# Patient Record
Sex: Male | Born: 1960 | Race: Black or African American | Hispanic: No | Marital: Married | Smoking: Never smoker
Health system: Southern US, Community
[De-identification: ages and names within clinical notes are randomized; demographics above are authoritative.]

## PROBLEM LIST (undated history)

## (undated) DIAGNOSIS — G4730 Sleep apnea, unspecified: Secondary | ICD-10-CM

## (undated) DIAGNOSIS — K219 Gastro-esophageal reflux disease without esophagitis: Secondary | ICD-10-CM

## (undated) DIAGNOSIS — I10 Essential (primary) hypertension: Secondary | ICD-10-CM

## (undated) HISTORY — DX: Gastro-esophageal reflux disease without esophagitis: K21.9

## (undated) HISTORY — PX: COLONOSCOPY: SHX174

---

## 2005-05-17 ENCOUNTER — Inpatient Hospital Stay (HOSPITAL_COMMUNITY): Admission: AD | Admit: 2005-05-17 | Discharge: 2005-05-19 | Payer: Self-pay | Admitting: Internal Medicine

## 2010-07-03 ENCOUNTER — Emergency Department (HOSPITAL_COMMUNITY): Admission: EM | Admit: 2010-07-03 | Discharge: 2010-07-03 | Payer: Self-pay | Admitting: Emergency Medicine

## 2010-11-19 ENCOUNTER — Other Ambulatory Visit
Admission: RE | Admit: 2010-11-19 | Discharge: 2010-11-19 | Payer: Self-pay | Source: Home / Self Care | Admitting: Urology

## 2011-04-30 NOTE — H&P (Signed)
NAME:  Aaron Peck, Aaron Peck             ACCOUNT NO.:  [id number]   MEDICAL RECORD NO.:  [id number]          PATIENT TYPE:  INP   LOCATION:  A210                          FACILITY:  [facility]   PHYSICIAN:  Madelin Rear. Sherwood Gambler, MD  DATE OF BIRTH:  [birth year]   DATE OF ADMISSION:  05/17/2005  DATE OF DISCHARGE:  LH                                HISTORY & PHYSICAL   CHIEF COMPLAINT:  Chest pain.   HISTORY OF PRESENT ILLNESS:  Patient has had 2 days of on and off atypical  chest discomfort.  He states that on one occasion for several minutes it  radiated from the central sternal area through to his back.  He denied any  associated cardiopulmonary symptoms of palpitations, syncope, nausea,  vomiting or diaphoresis.  No shortness of breath.  No cough or sputum or  antidromal symptoms.  He denies any GI symptoms except he has some  indigestion several days prior to the onset of this.  He was not certain  whether it was related to dietary indiscretion or not.   PAST MEDICAL HISTORY:  He has had no previous surgeries.  No known  allergies.  He has had hypertension but is currently taking 240 mg of  Cardizem once daily for that.   SOCIAL HISTORY:  Does not smoke, drink or use any illicit drugs.   FAMILY HISTORY:  Positive for father deceased from carcinoma, mother with  hypertension, paternal grandmother had diabetes mellitus, unknown etiology  of death of maternal grandfather and maternal grandmother.  He has one child  that is healthy.   REVIEW OF SYSTEMS:  Under HPI.  All else is negative.   PHYSICAL EXAMINATION:  SKIN:  Unremarkable.  HEAD AND NECK:  No JVD or adenopathy.  Neck is supple.  CHEST:  Clear.  CARDIAC EXAM:  Regular rhythm without murmur, gallop or rub.  ABDOMEN:  Soft.  No organomegaly or mass.  EXTREMITIES:  Without clubbing, cyanosis or edema.  NEUROLOGIC EXAMINATION:  Nonfocal.  VITAL SIGNS:  His blood pressure in the office where he was evaluated, it  was elevated at  150/100.   His EKG in the office showed a slight change in V6 compared to 60-year-old  EKG comparison with some elevation in that segment of about a millimeter.  It is very subtle and might be lead placement.   IMPRESSION:  Unexplained chest pain with radiation to posterior thorax in a  hypertensive patient.  He will get a CT scan upon admission as well as  serial cardiac enzymes.  Differential diagnostic possibilities include acute  aortic dissection, ischemic pain with new onset of angina versus myocardial  infarction as well as esophageal etiologies including reflux and spasm.  Cardiology consultation is anticipated.       LJF/MEDQ  D:  05/17/2005  T:  05/17/2005  Job:  [id number]

## 2011-04-30 NOTE — Discharge Summary (Signed)
NAME:  Aaron Peck, Aaron Peck             ACCOUNT NO.:  [id number]   MEDICAL RECORD NO.:  [id number]          PATIENT TYPE:  INP   LOCATION:  A210                          FACILITY:  [facility]   PHYSICIAN:  Madelin Rear. Sherwood Gambler, MD  DATE OF BIRTH:  1961-03-21   DATE OF ADMISSION:  05/17/2005  DATE OF DISCHARGE:  06/07/2006LH                                 DISCHARGE SUMMARY   DISCHARGE MEDICATIONS:  1.  Cardizem CD 360 mg p.o. daily.  2.  HCTZ 12.5 mg p.o. daily.  3.  Aspirin 81 mg p.o. daily.   DISCHARGE DIAGNOSES:  Chest pain, unclear etiology.   SUMMARY:  The patient was admitted with atypical chest pain and equivocal  EKG changes based on comparison electrocardiograms.  Serial enzymes were  negative.  CT of the chest was negative.  The patient was seen in  consultation by Cardiology.  Discharge day symptomatic.  Follow up with  Cardiology for anticipated stress Cardiolite.       LJF/MEDQ  D:  06/18/2005  T:  06/18/2005  Job:  [id number]

## 2013-05-13 ENCOUNTER — Emergency Department (HOSPITAL_COMMUNITY): Payer: Managed Care, Other (non HMO)

## 2013-05-13 ENCOUNTER — Encounter (HOSPITAL_COMMUNITY): Payer: Self-pay

## 2013-05-13 ENCOUNTER — Emergency Department (HOSPITAL_COMMUNITY)
Admission: EM | Admit: 2013-05-13 | Discharge: 2013-05-13 | Disposition: A | Discharge status: Home / Self Care | Payer: Managed Care, Other (non HMO) | Attending: Emergency Medicine | Admitting: Emergency Medicine

## 2013-05-13 DIAGNOSIS — Z79899 Other long term (current) drug therapy: Secondary | ICD-10-CM | POA: Insufficient documentation

## 2013-05-13 DIAGNOSIS — I10 Essential (primary) hypertension: Secondary | ICD-10-CM | POA: Insufficient documentation

## 2013-05-13 DIAGNOSIS — R109 Unspecified abdominal pain: Secondary | ICD-10-CM | POA: Insufficient documentation

## 2013-05-13 DIAGNOSIS — R1030 Lower abdominal pain, unspecified: Secondary | ICD-10-CM

## 2013-05-13 DIAGNOSIS — Z7982 Long term (current) use of aspirin: Secondary | ICD-10-CM | POA: Insufficient documentation

## 2013-05-13 HISTORY — DX: Essential (primary) hypertension: I10

## 2013-05-13 LAB — URINALYSIS, ROUTINE W REFLEX MICROSCOPIC
Ketones, ur: NEGATIVE mg/dL
Leukocytes, UA: NEGATIVE
Protein, ur: NEGATIVE mg/dL

## 2013-05-13 LAB — URINE MICROSCOPIC-ADD ON

## 2013-05-13 MED ORDER — HYDROCODONE-ACETAMINOPHEN 5-325 MG PO TABS
1.0000 | ORAL_TABLET | Freq: Once | ORAL | Status: AC
  Administered 2013-05-13: 1 via ORAL
  Filled 2013-05-13: qty 1

## 2013-05-13 MED ORDER — HYDROCODONE-ACETAMINOPHEN 5-325 MG PO TABS: 1.0000 | ORAL_TABLET | ORAL | Status: DC | PRN

## 2013-05-13 NOTE — ED Provider Notes (Signed)
History     CSN: [id number]  Arrival date & time 05/13/13  0122   First MD Initiated Contact with Patient 05/13/13 0208      No chief complaint on file.   (Consider location/radiation/quality/duration/timing/severity/associated sxs/prior treatment) HPI HPI Comments: Aaron Peck is a 52 y.o. male who presents to the Emergency Department complaining of right groin pain that began at 2300 and has been persistent aching. He took ibuprofen with no relief. Denies fever, chills, nausea, vomiting,shortness of breath. He has been urinating more than usual. He has no h/o kidney stones.   PCP Dr. Loleta Chance  Past Medical History  Diagnosis Date  . Hypertension     History reviewed. No pertinent past surgical history.  No family history on file.  History  Substance Use Topics  . Smoking status: Never Smoker   . Smokeless tobacco: Not on file  . Alcohol Use: No      Review of Systems  Constitutional: Negative for fever.       10 Systems reviewed and are negative for acute change except as noted in the HPI.  HENT: Negative for congestion.   Eyes: Negative for discharge and redness.  Respiratory: Negative for cough and shortness of breath.   Cardiovascular: Negative for chest pain.  Gastrointestinal: Negative for vomiting and abdominal pain.  Musculoskeletal: Negative for back pain.  Skin: Negative for rash.  Neurological: Negative for syncope, numbness and headaches.  Psychiatric/Behavioral:       No behavior change.    Allergies  Review of patient's allergies indicates no known allergies.  Home Medications   Current Outpatient Rx  Name  Route  Sig  Dispense  Refill  . aspirin 81 MG tablet   Oral   Take 81 mg by mouth daily.         Marland Kitchen diltiazem (CARDIZEM) 120 MG tablet   Oral   Take 120 mg by mouth 4 (four) times daily.         Marland Kitchen lisinopril (PRINIVIL,ZESTRIL) 20 MG tablet   Oral   Take 20 mg by mouth daily.         . Multiple Vitamin (MULTIVITAMIN)  capsule   Oral   Take 1 capsule by mouth daily.         Marland Kitchen omeprazole (PRILOSEC) 20 MG capsule   Oral   Take 20 mg by mouth daily.           BP 166/101  Temp(Src) 98 F (36.7 C) (Oral)  Resp 18  Ht 5\' 4"  (1.626 m)  Wt 200 lb (90.719 kg)  BMI 34.31 kg/m2  SpO2 100%  Physical Exam  Nursing note and vitals reviewed. Constitutional:  Awake, alert, nontoxic appearance.  HENT:  Head: Normocephalic and atraumatic.  Eyes: EOM are normal. Pupils are equal, round, and reactive to light.  Neck: Normal range of motion. Neck supple.  Cardiovascular: Normal rate and intact distal pulses.   Pulmonary/Chest: Effort normal and breath sounds normal. He exhibits no tenderness.  Abdominal: Soft. Bowel sounds are normal. There is no tenderness. There is no rebound.  No palpable tenderness noted  Genitourinary:  Genital and rectal exam performed with pt permission and male ED Tech chaparone present during exam.  Pt examined laying and standing.  No perineal erythema.  No penile lesions or drainage.  No scrotal erythema, edema or tenderness to palp.  Normal testicular lie.  No testicular tenderness to palp.  +cremasteric reflexes bilat.  No inguinal LAN or palpable masses.   Musculoskeletal:  He exhibits no tenderness.  Baseline ROM, no obvious new focal weakness.  Neurological:  Mental status and motor strength appears baseline for patient and situation.  Skin: No rash noted.  Psychiatric: He has a normal mood and affect.    ED Course  Procedures (including critical care time)  Results for orders placed during the hospital encounter of 05/13/13  URINALYSIS, ROUTINE W REFLEX MICROSCOPIC      Result Value Range   Color, Urine YELLOW  YELLOW   APPearance CLEAR  CLEAR   Specific Gravity, Urine 1.015  1.005 - 1.030   pH 7.5  5.0 - 8.0   Glucose, UA NEGATIVE  NEGATIVE mg/dL   Hgb urine dipstick SMALL (*) NEGATIVE   Bilirubin Urine NEGATIVE  NEGATIVE   Ketones, ur NEGATIVE  NEGATIVE mg/dL    Protein, ur NEGATIVE  NEGATIVE mg/dL   Urobilinogen, UA 0.2  0.0 - 1.0 mg/dL   Nitrite NEGATIVE  NEGATIVE   Leukocytes, UA NEGATIVE  NEGATIVE  URINE MICROSCOPIC-ADD ON      Result Value Range   Squamous Epithelial / LPF RARE  RARE   WBC, UA 0-2  <3 WBC/hpf   RBC / HPF 3-6  <3 RBC/hpf   Bacteria, UA RARE  RARE   Dg Abd Acute W/chest  05/13/2013   *RADIOLOGY REPORT*  Clinical Data: Right flank pain  ACUTE ABDOMEN SERIES (ABDOMEN 2 VIEW & CHEST 1 VIEW)  Comparison: None.  Findings: Lungs clear.  Cardiomediastinal contours unchanged.  No free intraperitoneal air. The bowel gas pattern is non-obstructive. Organ outlines are normal where seen. No acute or aggressive osseous abnormality identified.  No renal stone identified. Radiograph has limited sensitivity for stone detection.  IMPRESSION: Nonobstructive bowel gas pattern.   Original Report Authenticated By: Jearld Lesch, M.D.     MDM  Patient with right groin pain x 2 hours. No findings with genital exam. UA without acute findings. Acute abdominal series without findings. Given hydrocodone. Pt stable in ED with no significant deterioration in condition.The patient appears reasonably screened and/or stabilized for discharge and I doubt any other medical condition or other [facility] requiring further screening, evaluation, or treatment in the ED at this time prior to discharge.  MDM Reviewed: nursing note and vitals Interpretation: labs and x-ray           Nicoletta Dress. Colon Branch, MD 05/13/13 [phone]

## 2013-05-13 NOTE — ED Notes (Signed)
Pain in right groin x 2 hours, denies pain worse with movement.

## 2014-02-06 ENCOUNTER — Encounter: Payer: Self-pay | Admitting: Gastroenterology

## 2014-02-26 ENCOUNTER — Ambulatory Visit: Payer: Managed Care, Other (non HMO) | Admitting: Gastroenterology

## 2014-03-05 ENCOUNTER — Encounter (INDEPENDENT_AMBULATORY_CARE_PROVIDER_SITE_OTHER): Payer: Self-pay

## 2014-03-05 ENCOUNTER — Encounter: Payer: Self-pay | Admitting: Gastroenterology

## 2014-03-05 ENCOUNTER — Ambulatory Visit (INDEPENDENT_AMBULATORY_CARE_PROVIDER_SITE_OTHER): Payer: Managed Care, Other (non HMO) | Admitting: Gastroenterology

## 2014-03-05 ENCOUNTER — Encounter (HOSPITAL_COMMUNITY): Payer: Self-pay | Admitting: Pharmacy Technician

## 2014-03-05 VITALS — BP 135/87 | HR 77 | Temp 98.4°F | Ht 64.0 in | Wt 200.6 lb

## 2014-03-05 DIAGNOSIS — R195 Other fecal abnormalities: Secondary | ICD-10-CM | POA: Insufficient documentation

## 2014-03-05 MED ORDER — PEG 3350-KCL-NA BICARB-NACL 420 G PO SOLR: 4000.0000 mL | ORAL | Status: DC

## 2014-03-05 NOTE — Progress Notes (Signed)
Primary Care Physician:  Maggie Font, MD Primary Gastroenterologist:  Dr. Oneida Alar   Chief Complaint  Patient presents with  . Colon Cancer Screening    HPI:   Aaron Peck presents today at the request of Dr. Iona Beard secondary to heme positive stool. No hematochezia or melena. No abdominal pain. No constipation, diarrhea. No weight loss, lack of appetite. On Prilosec daily for GERD without breakthrough symptoms. No dysphagia. Recent Hgb 13.9. Last colonoscopy about 5 years ago, patient unsure who performed.   Past Medical History  Diagnosis Date  . Hypertension   . GERD (gastroesophageal reflux disease)     Past Surgical History  Procedure Laterality Date  . Colonoscopy      can not remeber when but it has been awhile, at least 5 years ago    Current Outpatient Prescriptions  Medication Sig Dispense Refill  . aspirin 81 MG tablet Take 81 mg by mouth daily.      Marland Kitchen lisinopril (PRINIVIL,ZESTRIL) 20 MG tablet Take 20 mg by mouth daily.      . Multiple Vitamin (MULTIVITAMIN) capsule Take 1 capsule by mouth daily.      Marland Kitchen omeprazole (PRILOSEC) 20 MG capsule Take 20 mg by mouth daily.      Marland Kitchen diltiazem (DILACOR XR) 120 MG 24 hr capsule Take 120 mg by mouth daily.       No current facility-administered medications for this visit.    Allergies as of 03/05/2014  . (No Known Allergies)    Family History  Problem Relation Age of Onset  . Colon cancer Neg Hx     History   Social History  . Marital Status: Single    Spouse Name: [name]    Number of Children: [name]  . Years of Education: [name]   Occupational History  . [employer]    Social History Main Topics  . Smoking status: Never Smoker   . Smokeless tobacco: Not on file  . Alcohol Use: No  . Drug Use: No  . Sexual Activity: Not on file   Other Topics Concern  . Not on file   Social History Narrative  . No narrative on file    Review of Systems: Gen: Denies any fever, chills, fatigue, weight loss, lack  of appetite.  CV: Denies chest pain, heart palpitations, peripheral edema, syncope.  Resp: Denies shortness of breath at rest or with exertion. Denies wheezing or cough.  GI: see HPI GU : Denies urinary burning, urinary frequency, urinary hesitancy MS: Denies joint pain, muscle weakness, cramps, or limitation of movement.  Derm: Denies rash, itching, dry skin Psych: Denies depression, anxiety, memory loss, and confusion Heme: Denies bruising, bleeding, and enlarged lymph nodes.  Physical Exam: BP 135/87  Pulse 77  Temp(Src) 98.4 F (36.9 C) (Oral)  Ht 5\' 4"  (1.626 m)  Wt 200 lb 9.6 oz (90.992 kg)  BMI 34.42 kg/m2 General:   Alert and oriented. Pleasant and cooperative. Well-nourished and well-developed.  Head:  Normocephalic and atraumatic. Eyes:  Without icterus, sclera clear and conjunctiva pink.  Ears:  Normal auditory acuity. Nose:  No deformity, discharge,  or lesions. Mouth:  No deformity or lesions, oral mucosa pink.  Neck:  Supple, without mass or thyromegaly. Lungs:  Clear to auscultation bilaterally. No wheezes, rales, or rhonchi. No distress.  Heart:  S1, S2 present without murmurs appreciated.  Abdomen:  +BS, soft, non-tender and non-distended. No HSM noted. No guarding or rebound. No masses appreciated.  Rectal:  Deferred  Msk:  Symmetrical without gross deformities. Normal posture. Extremities:  Without clubbing or edema. Neurologic:  Alert and  oriented x4;  grossly normal neurologically. Skin:  Intact without significant lesions or rashes. Cervical Nodes:  No significant cervical adenopathy. Psych:  Alert and cooperative. Normal mood and affect.

## 2014-03-05 NOTE — Patient Instructions (Signed)
We have scheduled you for a colonoscopy with Dr. Oneida Alar in the near future.  Further recommendations to follow once completed!

## 2014-03-05 NOTE — Assessment & Plan Note (Signed)
53 year old male with heme positive stool but no other concerning lower or upper GI symptoms. Last colonoscopy around 5 years ago, but patient is unsure who performed. hgb normal on outside labs. Proceed with colonoscopy with Dr. Oneida Alar in the near future. The risks, benefits, and alternatives have been discussed in detail with the patient. They state understanding and desire to proceed.

## 2014-03-06 NOTE — Progress Notes (Signed)
cc'd to pcp 

## 2014-03-12 ENCOUNTER — Ambulatory Visit (HOSPITAL_COMMUNITY)
Admission: RE | Admit: 2014-03-12 | Discharge: 2014-03-12 | Disposition: A | Discharge status: Home / Self Care | Payer: Managed Care, Other (non HMO) | Source: Ambulatory Visit | Attending: Gastroenterology | Admitting: Gastroenterology

## 2014-03-12 ENCOUNTER — Encounter (HOSPITAL_COMMUNITY)
Admission: RE | Disposition: A | Discharge status: Home / Self Care | Payer: Self-pay | Source: Ambulatory Visit | Attending: Gastroenterology

## 2014-03-12 ENCOUNTER — Encounter (HOSPITAL_COMMUNITY): Payer: Self-pay | Admitting: *Deleted

## 2014-03-12 DIAGNOSIS — K5730 Diverticulosis of large intestine without perforation or abscess without bleeding: Secondary | ICD-10-CM | POA: Insufficient documentation

## 2014-03-12 DIAGNOSIS — R195 Other fecal abnormalities: Secondary | ICD-10-CM

## 2014-03-12 DIAGNOSIS — K219 Gastro-esophageal reflux disease without esophagitis: Secondary | ICD-10-CM | POA: Insufficient documentation

## 2014-03-12 DIAGNOSIS — I10 Essential (primary) hypertension: Secondary | ICD-10-CM | POA: Insufficient documentation

## 2014-03-12 DIAGNOSIS — Z1211 Encounter for screening for malignant neoplasm of colon: Secondary | ICD-10-CM

## 2014-03-12 DIAGNOSIS — Z7982 Long term (current) use of aspirin: Secondary | ICD-10-CM | POA: Insufficient documentation

## 2014-03-12 DIAGNOSIS — K648 Other hemorrhoids: Secondary | ICD-10-CM | POA: Insufficient documentation

## 2014-03-12 DIAGNOSIS — Q438 Other specified congenital malformations of intestine: Secondary | ICD-10-CM | POA: Insufficient documentation

## 2014-03-12 DIAGNOSIS — Z79899 Other long term (current) drug therapy: Secondary | ICD-10-CM | POA: Insufficient documentation

## 2014-03-12 DIAGNOSIS — D126 Benign neoplasm of colon, unspecified: Secondary | ICD-10-CM

## 2014-03-12 HISTORY — PX: COLONOSCOPY: SHX5424

## 2014-03-12 SURGERY — COLONOSCOPY
Anesthesia: Moderate Sedation

## 2014-03-12 MED ORDER — STERILE WATER FOR IRRIGATION IR SOLN
Status: DC | PRN
  Administered 2014-03-12: 11:00:00

## 2014-03-12 MED ORDER — SODIUM CHLORIDE 0.9 % IV SOLN: INTRAVENOUS | Status: DC

## 2014-03-12 MED ORDER — MIDAZOLAM HCL 5 MG/5ML IJ SOLN
INTRAMUSCULAR | Status: AC
  Filled 2014-03-12: qty 10

## 2014-03-12 MED ORDER — MEPERIDINE HCL 100 MG/ML IJ SOLN
INTRAMUSCULAR | Status: DC | PRN
  Administered 2014-03-12 (×2): 25 mg via INTRAVENOUS

## 2014-03-12 MED ORDER — MEPERIDINE HCL 100 MG/ML IJ SOLN
INTRAMUSCULAR | Status: AC
  Filled 2014-03-12: qty 2

## 2014-03-12 MED ORDER — MIDAZOLAM HCL 5 MG/5ML IJ SOLN
INTRAMUSCULAR | Status: DC | PRN
  Administered 2014-03-12: 1 mg via INTRAVENOUS
  Administered 2014-03-12 (×2): 2 mg via INTRAVENOUS

## 2014-03-12 NOTE — Progress Notes (Signed)
REVIEWED.  

## 2014-03-12 NOTE — Op Note (Signed)
[facility] [address] [place], [address]   COLONOSCOPY PROCEDURE REPORT  PATIENT: Aaron Peck, Aaron Peck.  MR#: [id number] BIRTHDATE: July 05, 1961 , 53  yrs. old GENDER: Male ENDOSCOPIST: Barney Drain, MD REFERRED IW:[id number] Hill, M.D. PROCEDURE DATE:  03/12/2014 PROCEDURE:   Colonoscopy with cold biopsy polypectomy INDICATIONS:Average risk patient for colon cancer. MEDICATIONS: Demerol 50 mg IV and Versed 5 mg IV  DESCRIPTION OF PROCEDURE:    Physical exam was performed.  Informed consent was obtained from the patient after explaining the benefits, risks, and alternatives to procedure.  The patient was connected to monitor and placed in left lateral position. Continuous oxygen was provided by nasal cannula and IV medicine administered through an indwelling cannula.  After administration of sedation and rectal exam, the patients rectum was intubated and the EG-[id number] ([id number]) and EC-[id number] ([id number])  colonoscope was advanced under direct visualization to the ileum.  The scope was removed slowly by carefully examining the color, texture, anatomy, and integrity mucosa on the way out.  The patient was recovered in endoscopy and discharged home in satisfactory condition.    COLON FINDINGS: The mucosa appeared normal in the terminal ileum.  , A sessile polyp measuring 2 mm in size was found in the sigmoid colon.  A polypectomy was performed with cold forceps.  , The LEFT colon IS SLIGHTLY redundant.  , Mild diverticulosis was noted in the descending colon and sigmoid colon.  Large internal hemorrhoids were found.  PREP QUALITY: good.  CECAL W/D TIME: 12 minutes     COMPLICATIONS: None  ENDOSCOPIC IMPRESSION: 1.   Normal mucosa in the terminal ileum 2.   ONE COLON POLYP REMOVED 3.   The colon Is redundant 4.   Mild diverticulosis  in the descending colon and sigmoid colon 5.   Large internal hemorrhoids   RECOMMENDATIONS: AWAIT BIOPSY HIGH FIBER DIET TCS IN  10 YEARS.  CONSIDER OVERTUBE.       _______________________________ Lorrin MaisBarney Drain, MD 03/12/2014 11:22 AM

## 2014-03-12 NOTE — Discharge Instructions (Signed)
You had 1 small polyp removed. You have LARGE internal hemorrhoids and diverticulosis IN YOUR LEFT COLON.   FOLLOW A HIGH FIBER DIET. AVOID ITEMS THAT CAUSE BLOATING. SEE INFO BELOW.  YOUR BIOPSY RESULTS SHOULD BE BACK IN 7 DAYS.  Next colonoscopy in 10 years.   Colonoscopy Care After Read the instructions outlined below and refer to this sheet in the next week. These discharge instructions provide you with general information on caring for yourself after you leave the hospital. While your treatment has been planned according to the most current medical practices available, unavoidable complications occasionally occur. If you have any problems or questions after discharge, call DR. Ketty Bitton, [phone].  ACTIVITY  You may resume your regular activity, but move at a slower pace for the next 24 hours.   Take frequent rest periods for the next 24 hours.   Walking will help get rid of the air and reduce the bloated feeling in your belly (abdomen).   No driving for 24 hours (because of the medicine (anesthesia) used during the test).   You may shower.   Do not sign any important legal documents or operate any machinery for 24 hours (because of the anesthesia used during the test).    NUTRITION  Drink plenty of fluids.   You may resume your normal diet as instructed by your doctor.   Begin with a light meal and progress to your normal diet. Heavy or fried foods are harder to digest and may make you feel sick to your stomach (nauseated).   Avoid alcoholic beverages for 24 hours or as instructed.    MEDICATIONS  You may resume your normal medications.   WHAT YOU CAN EXPECT TODAY  Some feelings of bloating in the abdomen.   Passage of more gas than usual.   Spotting of blood in your stool or on the toilet paper  .  IF YOU HAD POLYPS REMOVED DURING THE COLONOSCOPY:  Eat a soft diet IF YOU HAVE NAUSEA, BLOATING, ABDOMINAL PAIN, OR VOMITING.    FINDING OUT THE RESULTS  OF YOUR TEST Not all test results are available during your visit. DR. Oneida Alar WILL CALL YOU WITHIN 7 DAYS OF YOUR PROCEDUE WITH YOUR RESULTS. Do not assume everything is normal if you have not heard from DR. Rylei Codispoti IN ONE WEEK, CALL HER OFFICE AT [phone].  SEEK IMMEDIATE MEDICAL ATTENTION AND CALL THE OFFICE: [phone] IF:  You have more than a spotting of blood in your stool.   Your belly is swollen (abdominal distention).   You are nauseated or vomiting.   You have a temperature over 101F.   You have abdominal pain or discomfort that is severe or gets worse throughout the day.  Polyps, Colon  A polyp is extra tissue that grows inside your body. Colon polyps grow in the large intestine. The large intestine, also called the colon, is part of your digestive system. It is a long, hollow tube at the end of your digestive tract where your body makes and stores stool. Most polyps are not dangerous. They are benign. This means they are not cancerous. But over time, some types of polyps can turn into cancer. Polyps that are smaller than a pea are usually not harmful. But larger polyps could someday become or may already be cancerous. To be safe, doctors remove all polyps and test them.   WHO GETS POLYPS? Anyone can get polyps, but certain people are more likely than others. You may have a greater chance of  getting polyps if:  You are over 50.   You have had polyps before.   Someone in your family has had polyps.   Someone in your family has had cancer of the large intestine.   Find out if someone in your family has had polyps. You may also be more likely to get polyps if you:   Eat a lot of fatty foods   Smoke   Drink alcohol   Do not exercise  Eat too much   TREATMENT  The caregiver will remove the polyp during sigmoidoscopy or colonoscopy.  PREVENTION There is not one sure way to prevent polyps. You might be able to lower your risk of getting them if you:  Eat more  fruits and vegetables and less fatty food.   Do not smoke.   Avoid alcohol.   Exercise every day.   Lose weight if you are overweight.   Eating more calcium and folate can also lower your risk of getting polyps. Some foods that are rich in calcium are milk, cheese, and broccoli. Some foods that are rich in folate are chickpeas, kidney beans, and spinach.   High-Fiber Diet A high-fiber diet changes your normal diet to include more whole grains, legumes, fruits, and vegetables. Changes in the diet involve replacing refined carbohydrates with unrefined foods. The calorie level of the diet is essentially unchanged. The Dietary Reference Intake (recommended amount) for adult males is 38 grams per day. For adult females, it is 25 grams per day. Pregnant and lactating women should consume 28 grams of fiber per day. Fiber is the intact part of a plant that is not broken down during digestion. Functional fiber is fiber that has been isolated from the plant to provide a beneficial effect in the body. PURPOSE  Increase stool bulk.   Ease and regulate bowel movements.   Lower cholesterol.  INDICATIONS THAT YOU NEED MORE FIBER  Constipation and hemorrhoids.   Uncomplicated diverticulosis (intestine condition) and irritable bowel syndrome.   Weight management.   As a protective measure against hardening of the arteries (atherosclerosis), diabetes, and cancer.   GUIDELINES FOR INCREASING FIBER IN THE DIET  Start adding fiber to the diet slowly. A gradual increase of about 5 more grams (2 slices of whole-wheat bread, 2 servings of most fruits or vegetables, or 1 bowl of high-fiber cereal) per day is best. Too rapid an increase in fiber may result in constipation, flatulence, and bloating.   Drink enough water and fluids to keep your urine clear or pale yellow. Water, juice, or caffeine-free drinks are recommended. Not drinking enough fluid may cause constipation.   Eat a variety of high-fiber  foods rather than one type of fiber.   Try to increase your intake of fiber through using high-fiber foods rather than fiber pills or supplements that contain small amounts of fiber.   The goal is to change the types of food eaten. Do not supplement your present diet with high-fiber foods, but replace foods in your present diet.  INCLUDE A VARIETY OF FIBER SOURCES  Replace refined and processed grains with whole grains, canned fruits with fresh fruits, and incorporate other fiber sources. White rice, white breads, and most bakery goods contain little or no fiber.   Brown whole-grain rice, buckwheat oats, and many fruits and vegetables are all good sources of fiber. These include: broccoli, Brussels sprouts, cabbage, cauliflower, beets, sweet potatoes, white potatoes (skin on), carrots, tomatoes, eggplant, squash, berries, fresh fruits, and dried fruits.  Cereals appear to be the richest source of fiber. Cereal fiber is found in whole grains and bran. Bran is the fiber-rich outer coat of cereal grain, which is largely removed in refining. In whole-grain cereals, the bran remains. In breakfast cereals, the largest amount of fiber is found in those with "bran" in their names. The fiber content is sometimes indicated on the label.   You may need to include additional fruits and vegetables each day.   In baking, for 1 cup white flour, you may use the following substitutions:   1 cup whole-wheat flour minus 2 tablespoons.   1/2 cup white flour plus 1/2 cup whole-wheat flour.   Diverticulosis Diverticulosis is a common condition that develops when small pouches (diverticula) form in the wall of the colon. The risk of diverticulosis increases with age. It happens more often in people who eat a low-fiber diet. Most individuals with diverticulosis have no symptoms. Those individuals with symptoms usually experience belly (abdominal) pain, constipation, or loose stools (diarrhea).  HOME CARE  INSTRUCTIONS  Increase the amount of fiber in your diet as directed by your caregiver or dietician. This may reduce symptoms of diverticulosis.   Drink at least 6 to 8 glasses of water each day to prevent constipation.   Try not to strain when you have a bowel movement.   Avoiding nuts and seeds to prevent complications is still an uncertain benefit.       FOODS HAVING HIGH FIBER CONTENT INCLUDE:  Fruits. Apple, peach, pear, tangerine, raisins, prunes.   Vegetables. Brussels sprouts, asparagus, broccoli, cabbage, carrot, cauliflower, romaine lettuce, spinach, summer squash, tomato, winter squash, zucchini.   Starchy Vegetables. Baked beans, kidney beans, lima beans, split peas, lentils, potatoes (with skin).   Grains. Whole wheat bread, brown rice, bran flake cereal, plain oatmeal, white rice, shredded wheat, bran muffins.    SEEK IMMEDIATE MEDICAL CARE IF:  You develop increasing pain or severe bloating.   You have an oral temperature above 101F.   You develop vomiting or bowel movements that are bloody or black.   Hemorrhoids Hemorrhoids are dilated (enlarged) veins around the rectum. Sometimes clots will form in the veins. This makes them swollen and painful. These are called thrombosed hemorrhoids. Causes of hemorrhoids include:  Constipation.   Straining to have a bowel movement.   HEAVY LIFTING HOME CARE INSTRUCTIONS  Eat a well balanced diet and drink 6 to 8 glasses of water every day to avoid constipation. You may also use a bulk laxative.   Avoid straining to have bowel movements.   Keep anal area dry and clean.   Do not use a donut shaped pillow or sit on the toilet for long periods. This increases blood pooling and pain.   Move your bowels when your body has the urge; this will require less straining and will decrease pain and pressure.

## 2014-03-12 NOTE — H&P (Signed)
  Primary Care Physician:  Maggie Font, MD Primary Gastroenterologist:  Dr. Oneida Alar  Pre-Procedure History & Physical: HPI:  Aaron Peck is a 53 y.o. male here for COLON CANCER SCREENING.  Past Medical History  Diagnosis Date  . Hypertension   . GERD (gastroesophageal reflux disease)     Past Surgical History  Procedure Laterality Date  . Colonoscopy      can not remeber when but it has been awhile, at least 5 years ago    Prior to Admission medications   Medication Sig Start Date End Date Taking? Authorizing Provider  aspirin 81 MG tablet Take 81 mg by mouth daily.   Yes Historical Provider, MD  diltiazem (DILACOR XR) 120 MG 24 hr capsule Take 120 mg by mouth daily.   Yes Historical Provider, MD  lisinopril (PRINIVIL,ZESTRIL) 20 MG tablet Take 20 mg by mouth daily.   Yes Historical Provider, MD  Multiple Vitamin (MULTIVITAMIN) capsule Take 1 capsule by mouth daily.   Yes Historical Provider, MD  omeprazole (PRILOSEC) 20 MG capsule Take 20 mg by mouth daily.   Yes Historical Provider, MD    Allergies as of 03/05/2014  . (No Known Allergies)    Family History  Problem Relation Age of Onset  . Colon cancer Neg Hx     History   Social History  . Marital Status: Married    Spouse Name: [name]    Number of Children: [name]  . Years of Education: [name]   Occupational History  . [employer]    Social History Main Topics  . Smoking status: Never Smoker   . Smokeless tobacco: Not on file  . Alcohol Use: No  . Drug Use: No  . Sexual Activity: Not on file   Other Topics Concern  . Not on file   Social History Narrative  . No narrative on file    Review of Systems: See HPI, otherwise negative ROS   Physical Exam: BP 161/97  Pulse 60  Temp(Src) 97.1 F (36.2 C) (Oral)  Resp 12  Ht 5\' 4"  (1.626 m)  Wt 200 lb (90.719 kg)  BMI 34.31 kg/m2  SpO2 100% General:   Alert,  pleasant and cooperative in NAD Head:  Normocephalic and atraumatic. Neck:  Supple; Lungs:   Clear throughout to auscultation.    Heart:  Regular rate and rhythm. Abdomen:  Soft, nontender and nondistended. Normal bowel sounds, without guarding, and without rebound.   Neurologic:  Alert and  oriented x4;  grossly normal neurologically.  Impression/Plan:     SCREENING  Plan:  1. TCS TODAY

## 2014-03-13 ENCOUNTER — Encounter (HOSPITAL_COMMUNITY): Payer: Self-pay | Admitting: Gastroenterology

## 2014-03-14 ENCOUNTER — Telehealth: Payer: Self-pay | Admitting: Gastroenterology

## 2014-03-14 NOTE — Telephone Encounter (Signed)
Called. Many rings and no answer.  

## 2014-03-14 NOTE — Telephone Encounter (Signed)
Please call pt. HE had a polypoid lesion removed and it was benign. FOLLOW A High fiber diet. TCS in 10 years.   

## 2014-03-14 NOTE — Telephone Encounter (Signed)
Pt returned call and was informed of results.  

## 2017-01-01 DIAGNOSIS — I10 Essential (primary) hypertension: Secondary | ICD-10-CM | POA: Diagnosis not present

## 2017-01-01 DIAGNOSIS — N400 Enlarged prostate without lower urinary tract symptoms: Secondary | ICD-10-CM | POA: Diagnosis not present

## 2017-03-02 DIAGNOSIS — K219 Gastro-esophageal reflux disease without esophagitis: Secondary | ICD-10-CM | POA: Diagnosis not present

## 2017-03-02 DIAGNOSIS — I10 Essential (primary) hypertension: Secondary | ICD-10-CM | POA: Diagnosis not present

## 2017-06-02 DIAGNOSIS — K219 Gastro-esophageal reflux disease without esophagitis: Secondary | ICD-10-CM | POA: Diagnosis not present

## 2017-06-02 DIAGNOSIS — Z0001 Encounter for general adult medical examination with abnormal findings: Secondary | ICD-10-CM | POA: Diagnosis not present

## 2017-06-02 DIAGNOSIS — I10 Essential (primary) hypertension: Secondary | ICD-10-CM | POA: Diagnosis not present

## 2017-06-03 ENCOUNTER — Ambulatory Visit (HOSPITAL_COMMUNITY)
Admission: RE | Admit: 2017-06-03 | Discharge: 2017-06-03 | Disposition: A | Discharge status: Home / Self Care | Payer: 59 | Source: Ambulatory Visit | Attending: Internal Medicine | Admitting: Internal Medicine

## 2017-06-03 ENCOUNTER — Other Ambulatory Visit (HOSPITAL_COMMUNITY): Payer: Self-pay | Admitting: Internal Medicine

## 2017-06-03 DIAGNOSIS — R9431 Abnormal electrocardiogram [ECG] [EKG]: Secondary | ICD-10-CM | POA: Insufficient documentation

## 2017-06-03 DIAGNOSIS — I10 Essential (primary) hypertension: Secondary | ICD-10-CM | POA: Diagnosis not present

## 2017-06-03 DIAGNOSIS — N182 Chronic kidney disease, stage 2 (mild): Secondary | ICD-10-CM

## 2017-06-07 ENCOUNTER — Ambulatory Visit (HOSPITAL_COMMUNITY)
Admission: RE | Admit: 2017-06-07 | Discharge: 2017-06-07 | Disposition: A | Discharge status: Home / Self Care | Payer: Managed Care, Other (non HMO) | Source: Ambulatory Visit | Attending: Internal Medicine | Admitting: Internal Medicine

## 2017-06-07 DIAGNOSIS — N182 Chronic kidney disease, stage 2 (mild): Secondary | ICD-10-CM | POA: Insufficient documentation

## 2017-06-07 DIAGNOSIS — R944 Abnormal results of kidney function studies: Secondary | ICD-10-CM | POA: Diagnosis not present

## 2017-06-07 DIAGNOSIS — N281 Cyst of kidney, acquired: Secondary | ICD-10-CM | POA: Diagnosis not present

## 2017-10-05 DIAGNOSIS — Z23 Encounter for immunization: Secondary | ICD-10-CM | POA: Diagnosis not present

## 2017-10-05 DIAGNOSIS — K219 Gastro-esophageal reflux disease without esophagitis: Secondary | ICD-10-CM | POA: Diagnosis not present

## 2017-10-05 DIAGNOSIS — I10 Essential (primary) hypertension: Secondary | ICD-10-CM | POA: Diagnosis not present

## 2017-10-05 DIAGNOSIS — N182 Chronic kidney disease, stage 2 (mild): Secondary | ICD-10-CM | POA: Diagnosis not present

## 2017-11-05 DIAGNOSIS — H524 Presbyopia: Secondary | ICD-10-CM | POA: Diagnosis not present

## 2018-01-13 DIAGNOSIS — N183 Chronic kidney disease, stage 3 (moderate): Secondary | ICD-10-CM | POA: Diagnosis not present

## 2018-01-13 DIAGNOSIS — Z125 Encounter for screening for malignant neoplasm of prostate: Secondary | ICD-10-CM | POA: Diagnosis not present

## 2018-01-13 DIAGNOSIS — I129 Hypertensive chronic kidney disease with stage 1 through stage 4 chronic kidney disease, or unspecified chronic kidney disease: Secondary | ICD-10-CM | POA: Diagnosis not present

## 2018-01-13 DIAGNOSIS — R7303 Prediabetes: Secondary | ICD-10-CM | POA: Diagnosis not present

## 2018-01-20 DIAGNOSIS — N189 Chronic kidney disease, unspecified: Secondary | ICD-10-CM | POA: Diagnosis not present

## 2018-01-20 DIAGNOSIS — N200 Calculus of kidney: Secondary | ICD-10-CM | POA: Diagnosis not present

## 2018-02-06 DIAGNOSIS — N183 Chronic kidney disease, stage 3 (moderate): Secondary | ICD-10-CM | POA: Diagnosis not present

## 2018-02-06 DIAGNOSIS — K219 Gastro-esophageal reflux disease without esophagitis: Secondary | ICD-10-CM | POA: Diagnosis not present

## 2018-02-06 DIAGNOSIS — I10 Essential (primary) hypertension: Secondary | ICD-10-CM | POA: Diagnosis not present

## 2018-02-09 IMAGING — US US RENAL
1 series · 14 of 25 positions shown · non-contrast
Comparison: None.

CLINICAL DATA: Elevated renal function tests

EXAM:
RENAL / URINARY TRACT ULTRASOUND COMPLETE

[Series 1: us renal · 0.22mm/px · 14 of 35 slices shown]
[im 1/35]
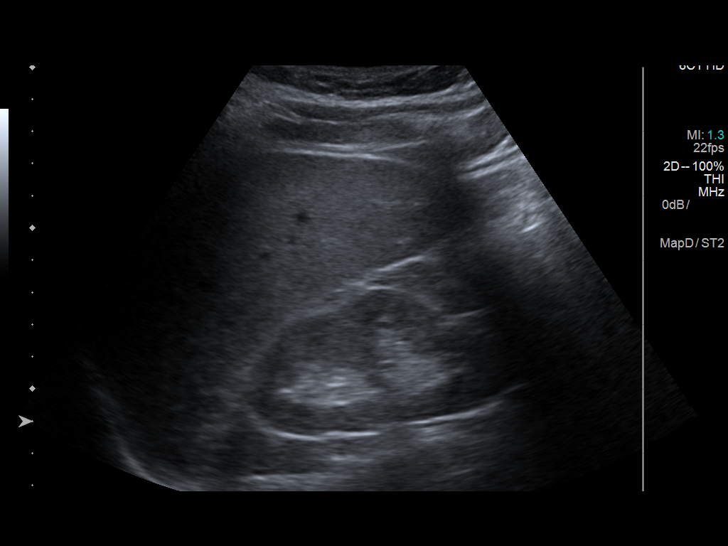
[im 3/35]
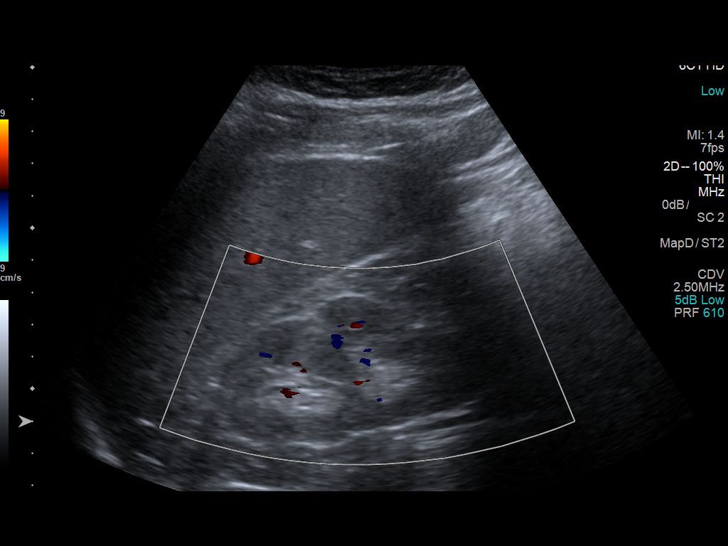
[im 6/35]
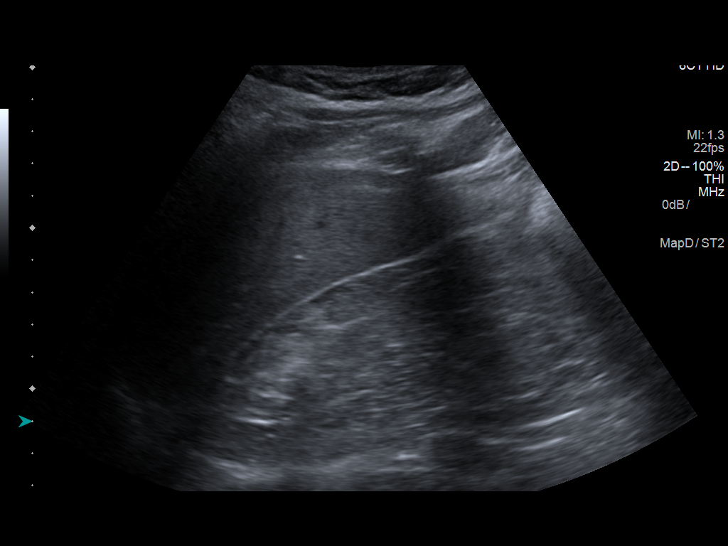
[im 9/35]
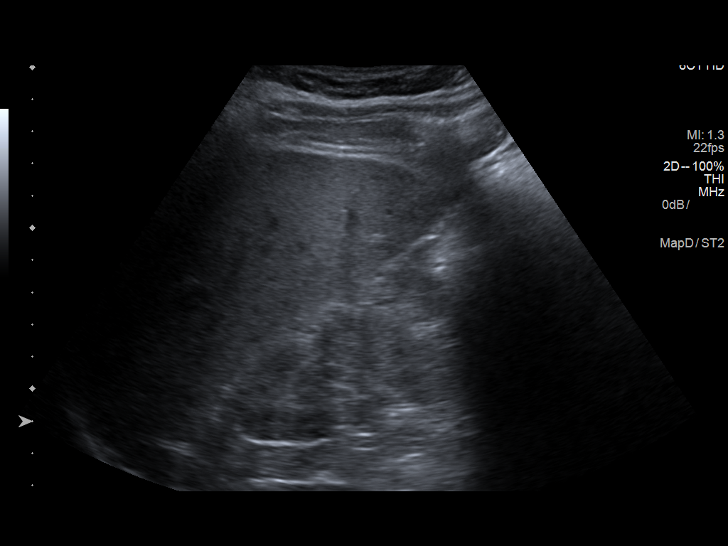
[im 12/35]
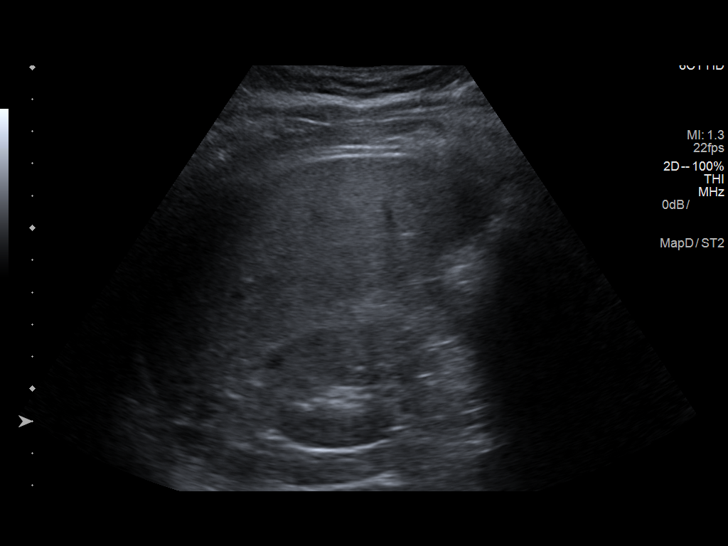
[im 13/35]
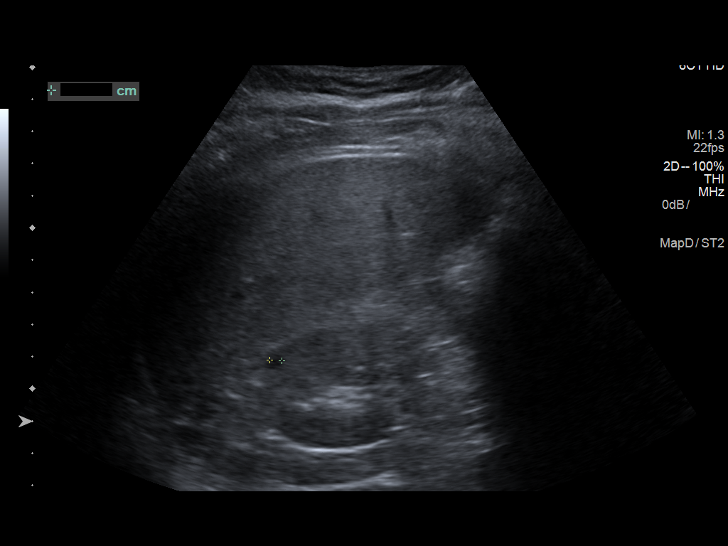
[im 16/35]
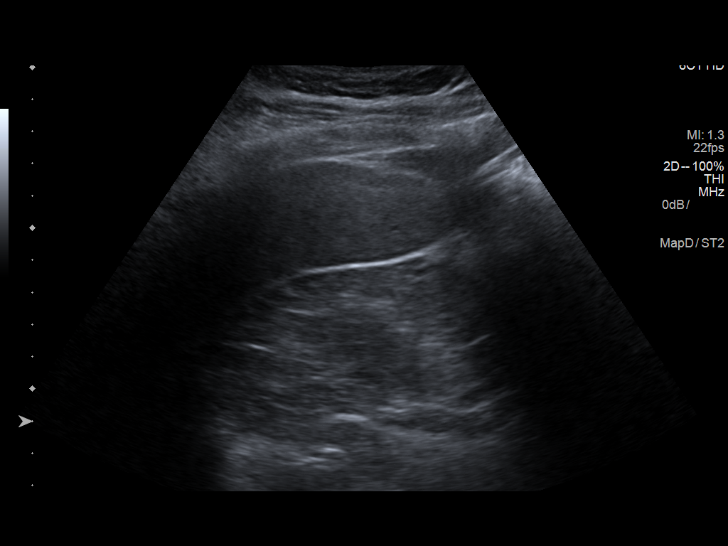
[im 19/35]
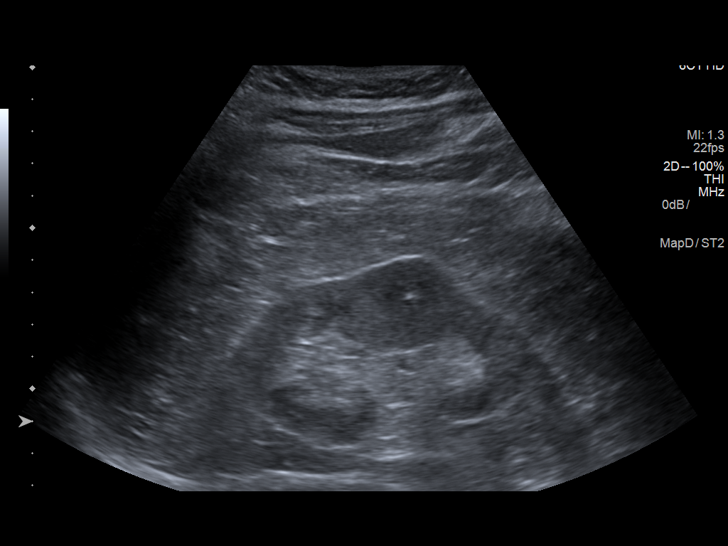
[im 22/35]
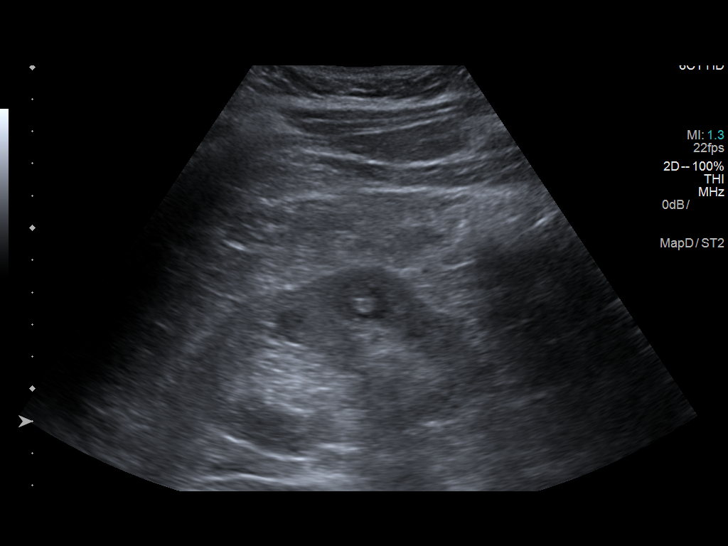
[im 23/35]
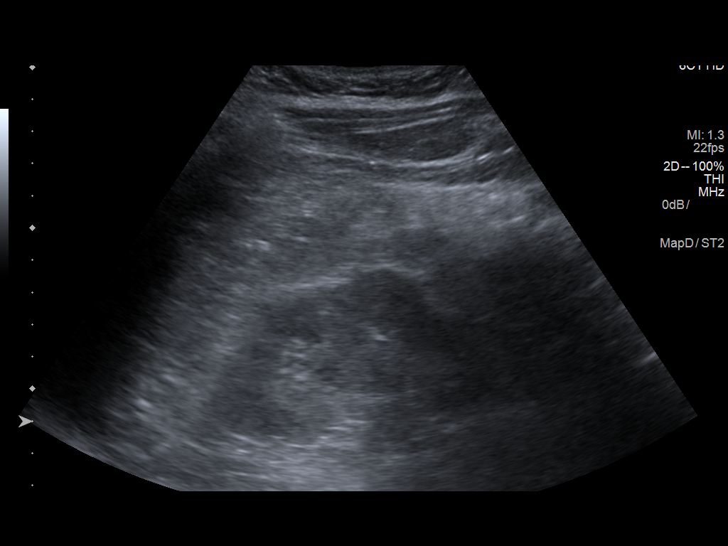
[im 26/35]
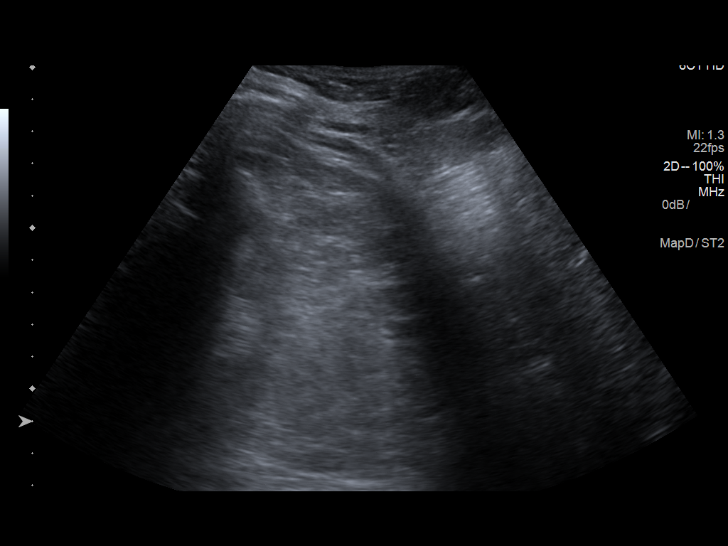
[im 29/35]
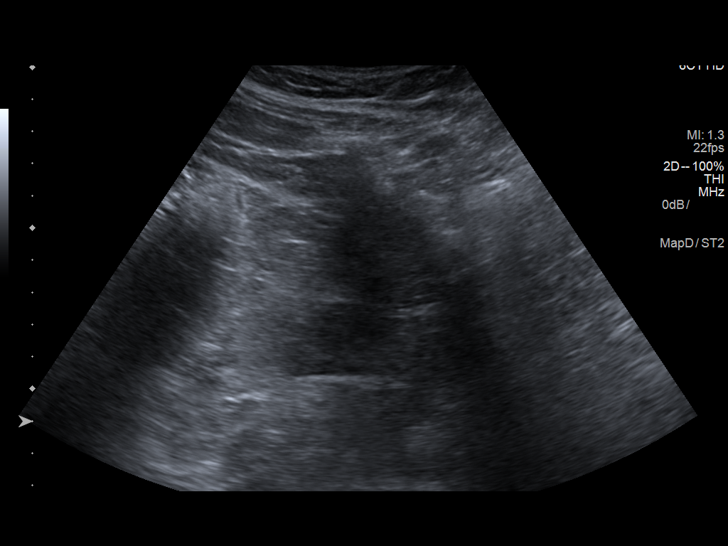
[im 32/35]
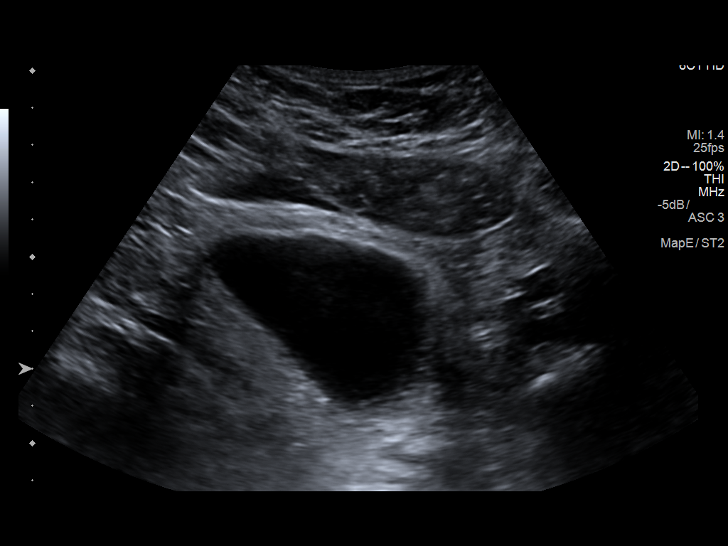
[im 35/35]
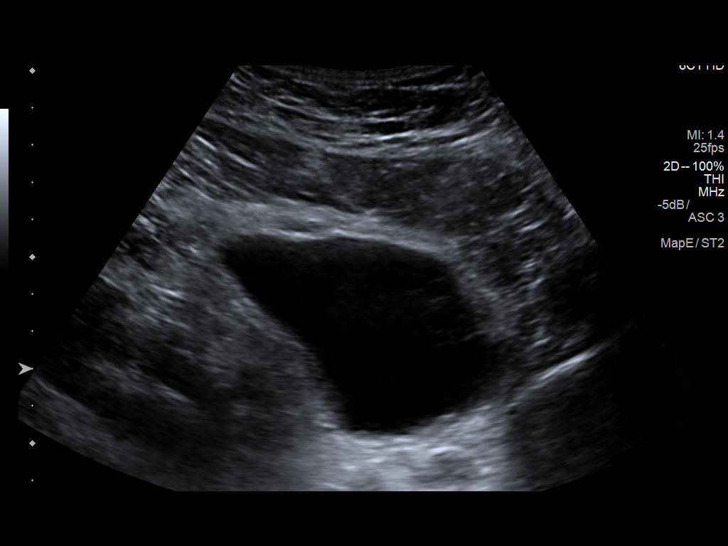

[14 of 25 positions shown; findings below may reference images not displayed]

FINDINGS: Right Kidney:

Length: 8.9 cm. No obstructive changes are noted. A 5 mm cyst is
noted in the upper pole.

Left Kidney:

Length: 9.2 cm.. Echogenicity within normal limits. No mass or
hydronephrosis visualized.

Bladder:

Appears normal for degree of bladder distention.
IMPRESSION: Tiny right renal cyst.  No acute abnormality noted.

## 2018-02-23 DIAGNOSIS — R944 Abnormal results of kidney function studies: Secondary | ICD-10-CM | POA: Diagnosis not present

## 2018-02-23 DIAGNOSIS — I10 Essential (primary) hypertension: Secondary | ICD-10-CM | POA: Diagnosis not present

## 2018-02-23 DIAGNOSIS — R809 Proteinuria, unspecified: Secondary | ICD-10-CM | POA: Diagnosis not present

## 2018-05-05 DIAGNOSIS — N200 Calculus of kidney: Secondary | ICD-10-CM | POA: Diagnosis not present

## 2018-05-05 DIAGNOSIS — I129 Hypertensive chronic kidney disease with stage 1 through stage 4 chronic kidney disease, or unspecified chronic kidney disease: Secondary | ICD-10-CM | POA: Diagnosis not present

## 2018-05-05 DIAGNOSIS — N183 Chronic kidney disease, stage 3 (moderate): Secondary | ICD-10-CM | POA: Diagnosis not present

## 2018-06-05 DIAGNOSIS — N183 Chronic kidney disease, stage 3 (moderate): Secondary | ICD-10-CM | POA: Diagnosis not present

## 2018-06-05 DIAGNOSIS — K219 Gastro-esophageal reflux disease without esophagitis: Secondary | ICD-10-CM | POA: Diagnosis not present

## 2018-06-05 DIAGNOSIS — Z0000 Encounter for general adult medical examination without abnormal findings: Secondary | ICD-10-CM | POA: Diagnosis not present

## 2018-06-05 DIAGNOSIS — I10 Essential (primary) hypertension: Secondary | ICD-10-CM | POA: Diagnosis not present

## 2018-07-12 ENCOUNTER — Encounter (HOSPITAL_COMMUNITY): Payer: Self-pay

## 2018-07-12 ENCOUNTER — Emergency Department (HOSPITAL_COMMUNITY)
Admission: EM | Admit: 2018-07-12 | Discharge: 2018-07-12 | Disposition: A | Discharge status: Home / Self Care | Payer: 59 | Attending: Emergency Medicine | Admitting: Emergency Medicine

## 2018-07-12 ENCOUNTER — Other Ambulatory Visit: Payer: Self-pay

## 2018-07-12 DIAGNOSIS — Y939 Activity, unspecified: Secondary | ICD-10-CM | POA: Insufficient documentation

## 2018-07-12 DIAGNOSIS — Y999 Unspecified external cause status: Secondary | ICD-10-CM | POA: Insufficient documentation

## 2018-07-12 DIAGNOSIS — I10 Essential (primary) hypertension: Secondary | ICD-10-CM | POA: Diagnosis not present

## 2018-07-12 DIAGNOSIS — T63461A Toxic effect of venom of wasps, accidental (unintentional), initial encounter: Secondary | ICD-10-CM | POA: Insufficient documentation

## 2018-07-12 DIAGNOSIS — Z79899 Other long term (current) drug therapy: Secondary | ICD-10-CM | POA: Diagnosis not present

## 2018-07-12 DIAGNOSIS — T63441A Toxic effect of venom of bees, accidental (unintentional), initial encounter: Secondary | ICD-10-CM | POA: Diagnosis not present

## 2018-07-12 DIAGNOSIS — Y929 Unspecified place or not applicable: Secondary | ICD-10-CM | POA: Diagnosis not present

## 2018-07-12 MED ORDER — EPINEPHRINE 0.3 MG/0.3ML IJ SOAJ: 0.3000 mg | Freq: Once | INTRAMUSCULAR | 0 refills | Status: AC

## 2018-07-12 MED ORDER — DEXAMETHASONE 4 MG PO TABS: 4.0000 mg | ORAL_TABLET | Freq: Two times a day (BID) | ORAL | 0 refills | Status: DC

## 2018-07-12 NOTE — Discharge Instructions (Addendum)
Your vital signs are within normal limits with the exception of your blood pressure being elevated at 170/88.  Your examination does not show any evidence of acute or anaphylactic type reaction.  During her observation time here in the emergency department, no hives or unusual swelling noted.  Please use the Allegra tablets each morning, and use the Benadryl at bedtime for itching and for the yellowjacket sting.  Continue to keep your EpiPen close by at all times.  Please use Decadron 2 times daily with food.  Return to the emergency department immediately if any signs of emergent reaction.

## 2018-07-12 NOTE — ED Notes (Signed)
Pt does have a history of hypertension. Did take medication today, but is a bit anxious.

## 2018-07-12 NOTE — ED Provider Notes (Signed)
[facility] EMERGENCY DEPARTMENT Provider Note   CSN: [id number] Arrival date & time: 07/12/18  1115     History   Chief Complaint Chief Complaint  Patient presents with  . Insect Bite    HPI Aaron Peck is a 57 y.o. male.   Patient is a 57 year old male who presents to the emergency department with a complaint of an insect bite.  The patient states that about 10 AM this morning he sustained a yellowjacket bite to the left side of the head.  He noted swelling.  In the past he has had a reaction with hives.  He took Benadryl and then used his EpiPen.  He states he did not have any unusual shortness of breath.  He has not noticed any swelling about his face or tongue.  He injected the EpiPen and then came to the emergency department for evaluation.  To his knowledge he only sustained one sting.   The history is provided by the patient.    Past Medical History:  Diagnosis Date  . GERD (gastroesophageal reflux disease)   . Hypertension     Patient Active Problem List   Diagnosis Date Noted  . Heme positive stool 03/05/2014    Past Surgical History:  Procedure Laterality Date  . COLONOSCOPY     can not remeber when but it has been awhile, at least 5 years ago  . COLONOSCOPY [name] 03/12/2014   Procedure: COLONOSCOPY;  Surgeon: Danie Binder, MD;  Location: [facility];  Service: Endoscopy;  Laterality: [name];  10:00        Home Medications    Prior to Admission medications   Medication Sig Start Date End Date Taking? Authorizing Provider  aspirin 81 MG tablet Take 81 mg by mouth daily.    [provider]  diltiazem (DILACOR XR) 120 MG 24 hr capsule Take 120 mg by mouth daily.    [provider]  lisinopril (PRINIVIL,ZESTRIL) 20 MG tablet Take 20 mg by mouth daily.    [provider]  Multiple Vitamin (MULTIVITAMIN) capsule Take 1 capsule by mouth daily.    [provider]  omeprazole (PRILOSEC) 20 MG capsule Take 20 mg by mouth  daily.    [provider]    Family History Family History  Problem Relation Age of Onset  . Colon cancer Neg Hx     Social History Social History   Tobacco Use  . Smoking status: Never Smoker  Substance Use Topics  . Alcohol use: No  . Drug use: No     Allergies   Patient has no known allergies.   Review of Systems Review of Systems  Constitutional: Negative for activity change.       All ROS Neg except as noted in HPI  HENT: Negative for nosebleeds.   Eyes: Negative for photophobia and discharge.  Respiratory: Negative for cough, shortness of breath and wheezing.   Cardiovascular: Negative for chest pain and palpitations.  Gastrointestinal: Negative for abdominal pain and blood in stool.  Genitourinary: Negative for dysuria, frequency and hematuria.  Musculoskeletal: Negative for arthralgias, back pain and neck pain.  Skin: Negative.   Neurological: Negative for dizziness, seizures and speech difficulty.  Psychiatric/Behavioral: Negative for confusion and hallucinations.     Physical Exam Updated Vital Signs BP (!) 170/88   Pulse 94   Temp 98.3 F (36.8 C)   Resp 12   Ht 5\' 4"  (1.626 m)   Wt 93 kg (205 lb)  SpO2 95%   BMI 35.19 kg/m    Physical Exam  Constitutional: He is oriented to person, place, and time. He appears well-developed and well-nourished.  Non-toxic appearance.  HENT:  Head: Normocephalic.  Right Ear: Tympanic membrane and external ear normal.  Left Ear: Tympanic membrane and external ear normal.  There is a slightly raised area to the left side of the head.  No difficulty with opening and closing the mouth.  The oropharynx is clear.  The uvula is in the midline.  There is no swelling of the tongue of the lips.  Eyes: Pupils are equal, round, and reactive to light. EOM and lids are normal.  Neck: Normal range of motion. Neck supple. Carotid bruit is not present.  Cardiovascular: Normal rate, regular rhythm, normal heart  sounds, intact distal pulses and normal pulses.  Pulmonary/Chest: Breath sounds normal. No stridor. No respiratory distress. He has no wheezes.  Abdominal: Soft. Bowel sounds are normal. There is no tenderness. There is no guarding.  Musculoskeletal: Normal range of motion.  Lymphadenopathy:       Head (right side): No submandibular adenopathy present.       Head (left side): No submandibular adenopathy present.    He has no cervical adenopathy.  Neurological: He is alert and oriented to person, place, and time. He has normal strength. No cranial nerve deficit or sensory deficit.  Skin: Skin is warm and dry.  Psychiatric: He has a normal mood and affect. His speech is normal.  Nursing note and vitals reviewed.    ED Treatments / Results  Labs (all labs ordered are listed, but only abnormal results are displayed) Labs Reviewed - No data to display  EKG None  Radiology No results found.  Procedures Procedures (including critical care time)  Medications Ordered in ED Medications - No data to display   Initial Impression / Assessment and Plan / ED Course  I have reviewed the triage vital signs and the nursing notes.  Pertinent labs & imaging results that were available during my care of the patient were reviewed by me and considered in my medical decision making (see chart for details).       Final Clinical Impressions(s)  MDM  Patient sustained a yellowjacket bite to the left side of the head earlier this morning.  No wheezing, no unusual shortness of breath, no hives or other rash.  The patient states that he took Benadryl and he also used his EpiPen prior to coming to the emergency department.  Patient in no distress at this time.  Patient observed here in the emergency department.  There are no changes in breathing.  No difficulty with breathing, no unusual swelling, and no rash or hives appreciated.  At discharge patient is ambulatory.  Patient is conversing on the  telephone without any problem.  Patient speaks in complete sentences without problem.  Prescription given to the patient to replace his EpiPen.  We discussed continuing to keep the EpiPen with him at all times.  And we discussed the importance of him returning to the emergency department immediately if any changes in his condition, problems, or concerns.  Patient is in agreement with this plan.   Final diagnoses:  Bee sting, accidental or unintentional, initial encounter    ED Discharge Orders        Ordered    EPINEPHrine 0.3 mg/0.3 mL IJ SOAJ injection   Once     07/12/18 1301    dexamethasone (DECADRON) 4 MG tablet  2 times daily with meals     07/12/18 1301      Lily Kocher, PA-C 07/12/18 1338  Carmin Muskrat, MD 07/12/18 [phone]

## 2018-07-12 NOTE — ED Triage Notes (Signed)
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 Pt states he feels fine, but site where bee stung him hurts. Is alert and oriented. Breathing is normal and non labored.

## 2018-09-04 DIAGNOSIS — I10 Essential (primary) hypertension: Secondary | ICD-10-CM | POA: Diagnosis not present

## 2018-09-04 DIAGNOSIS — K219 Gastro-esophageal reflux disease without esophagitis: Secondary | ICD-10-CM | POA: Diagnosis not present

## 2018-09-04 DIAGNOSIS — N182 Chronic kidney disease, stage 2 (mild): Secondary | ICD-10-CM | POA: Diagnosis not present

## 2018-09-04 DIAGNOSIS — Z23 Encounter for immunization: Secondary | ICD-10-CM | POA: Diagnosis not present

## 2018-11-10 DIAGNOSIS — H524 Presbyopia: Secondary | ICD-10-CM | POA: Diagnosis not present

## 2018-11-10 DIAGNOSIS — N183 Chronic kidney disease, stage 3 (moderate): Secondary | ICD-10-CM | POA: Diagnosis not present

## 2018-11-10 DIAGNOSIS — R7303 Prediabetes: Secondary | ICD-10-CM | POA: Diagnosis not present

## 2018-11-10 DIAGNOSIS — I129 Hypertensive chronic kidney disease with stage 1 through stage 4 chronic kidney disease, or unspecified chronic kidney disease: Secondary | ICD-10-CM | POA: Diagnosis not present

## 2018-11-10 DIAGNOSIS — Z125 Encounter for screening for malignant neoplasm of prostate: Secondary | ICD-10-CM | POA: Diagnosis not present

## 2018-11-18 DIAGNOSIS — H40003 Preglaucoma, unspecified, bilateral: Secondary | ICD-10-CM | POA: Diagnosis not present

## 2018-12-08 DIAGNOSIS — K219 Gastro-esophageal reflux disease without esophagitis: Secondary | ICD-10-CM | POA: Diagnosis not present

## 2018-12-08 DIAGNOSIS — I10 Essential (primary) hypertension: Secondary | ICD-10-CM | POA: Diagnosis not present

## 2018-12-08 DIAGNOSIS — N183 Chronic kidney disease, stage 3 (moderate): Secondary | ICD-10-CM | POA: Diagnosis not present

## 2019-03-09 DIAGNOSIS — K219 Gastro-esophageal reflux disease without esophagitis: Secondary | ICD-10-CM | POA: Diagnosis not present

## 2019-03-09 DIAGNOSIS — N183 Chronic kidney disease, stage 3 (moderate): Secondary | ICD-10-CM | POA: Diagnosis not present

## 2019-03-09 DIAGNOSIS — I10 Essential (primary) hypertension: Secondary | ICD-10-CM | POA: Diagnosis not present

## 2019-07-10 ENCOUNTER — Other Ambulatory Visit: Payer: Self-pay | Admitting: Internal Medicine

## 2019-07-10 ENCOUNTER — Other Ambulatory Visit (HOSPITAL_COMMUNITY): Payer: Self-pay | Admitting: Internal Medicine

## 2019-07-10 DIAGNOSIS — N183 Chronic kidney disease, stage 3 unspecified: Secondary | ICD-10-CM

## 2019-07-12 ENCOUNTER — Ambulatory Visit (HOSPITAL_COMMUNITY): Payer: 59

## 2019-07-17 ENCOUNTER — Ambulatory Visit (HOSPITAL_COMMUNITY)
Admission: RE | Admit: 2019-07-17 | Discharge: 2019-07-17 | Disposition: A | Discharge status: Home / Self Care | Payer: 59 | Source: Ambulatory Visit | Attending: Internal Medicine | Admitting: Internal Medicine

## 2019-07-17 ENCOUNTER — Other Ambulatory Visit: Payer: Self-pay

## 2019-07-17 DIAGNOSIS — N183 Chronic kidney disease, stage 3 unspecified: Secondary | ICD-10-CM

## 2019-08-06 ENCOUNTER — Other Ambulatory Visit: Payer: Self-pay

## 2019-08-06 DIAGNOSIS — Z20822 Contact with and (suspected) exposure to covid-19: Secondary | ICD-10-CM

## 2019-08-07 ENCOUNTER — Telehealth: Payer: Self-pay | Admitting: Internal Medicine

## 2019-08-07 LAB — NOVEL CORONAVIRUS, NAA: SARS-CoV-2, NAA: NOT DETECTED

## 2019-08-07 NOTE — Telephone Encounter (Signed)
Negative COVID results given. Patient results "NOT Detected." Caller expressed understanding. ° °

## 2019-11-16 ENCOUNTER — Other Ambulatory Visit: Payer: Self-pay

## 2019-11-16 DIAGNOSIS — Z20822 Contact with and (suspected) exposure to covid-19: Secondary | ICD-10-CM

## 2019-11-18 LAB — NOVEL CORONAVIRUS, NAA: SARS-CoV-2, NAA: NOT DETECTED

## 2019-11-19 ENCOUNTER — Telehealth: Payer: Self-pay | Admitting: Internal Medicine

## 2019-11-19 NOTE — Telephone Encounter (Signed)
Negative COVID results given. Patient results "NOT Detected." Caller expressed understanding. ° °

## 2020-02-16 ENCOUNTER — Ambulatory Visit
Admission: EM | Admit: 2020-02-16 | Discharge: 2020-02-16 | Disposition: A | Discharge status: Home / Self Care | Payer: 59

## 2020-02-16 ENCOUNTER — Other Ambulatory Visit: Payer: Self-pay

## 2020-02-16 DIAGNOSIS — Z20822 Contact with and (suspected) exposure to covid-19: Secondary | ICD-10-CM

## 2020-02-16 NOTE — ED Provider Notes (Signed)
[place]   [id number] 02/16/20 Arrival Time: 0930   CC: COVID exposure  SUBJECTIVE: History from: patient.  NESTA CUDE is a 59 y.o. male who presents for COVID testing.  Positive COVID exposure to wife.  Denies recent travel.  Denies aggravating or alleviating symptoms.  Denies previous COVID infection.   Denies fever, chills, fatigue, nasal congestion, rhinorrhea, sore throat, cough, SOB, wheezing, chest pain, nausea, vomiting, changes in bowel or bladder habits.    ROS: As per HPI.  All other pertinent ROS negative.     Past Medical History:  Diagnosis Date  . GERD (gastroesophageal reflux disease)   . Hypertension    Past Surgical History:  Procedure Laterality Date  . COLONOSCOPY     can not remeber when but it has been awhile, at least 5 years ago  . COLONOSCOPY [name] 03/12/2014   Procedure: COLONOSCOPY;  Surgeon: Danie Binder, MD;  Location: [facility];  Service: Endoscopy;  Laterality: [name];  10:00   No Known Allergies No current facility-administered medications on file prior to encounter.   Current Outpatient Medications on File Prior to Encounter  Medication Sig Dispense Refill  . aspirin 81 MG tablet Take 81 mg by mouth daily.    Marland Kitchen diltiazem (DILACOR XR) 120 MG 24 hr capsule Take 120 mg by mouth daily.    Marland Kitchen losartan-hydrochlorothiazide (HYZAAR) 100-12.5 MG tablet Take 1 tablet by mouth daily.    . Multiple Vitamin (MULTIVITAMIN) capsule Take 1 capsule by mouth daily.    Marland Kitchen omeprazole (PRILOSEC) 20 MG capsule Take 20 mg by mouth daily.    . [DISCONTINUED] lisinopril (PRINIVIL,ZESTRIL) 20 MG tablet Take 20 mg by mouth daily.     Social History   Socioeconomic History  . Marital status: Married    Spouse name: Not on file  . Number of children: Not on file  . Years of education: Not on file  . Highest education level: Not on file  Occupational History  . Occupation: Technical brewer: [employer]  Tobacco Use  . Smoking status: Never Smoker    Substance and Sexual Activity  . Alcohol use: No  . Drug use: No  . Sexual activity: Not on file  Other Topics Concern  . Not on file  Social History Narrative  . Not on file   Social Determinants of Health   Financial Resource Strain:   . Difficulty of Paying Living Expenses: Not on file  Food Insecurity:   . Worried About Charity fundraiser in the Last Year: Not on file  . Ran Out of Food in the Last Year: Not on file  Transportation Needs:   . Lack of Transportation (Medical): Not on file  . Lack of Transportation (Non-Medical): Not on file  Physical Activity:   . Days of Exercise per Week: Not on file  . Minutes of Exercise per Session: Not on file  Stress:   . Feeling of Stress : Not on file  Social Connections:   . Frequency of Communication with Friends and Family: Not on file  . Frequency of Social Gatherings with Friends and Family: Not on file  . Attends Religious Services: Not on file  . Active Member of Clubs or Organizations: Not on file  . Attends Archivist Meetings: Not on file  . Marital Status: Not on file  Intimate Partner Violence:   . Fear of Current or Ex-Partner: Not on file  . Emotionally Abused: Not on file  .  Physically Abused: Not on file  . Sexually Abused: Not on file   Family History  Problem Relation Age of Onset  . Hypertension Mother   . Cancer Father   . Colon cancer Neg Hx     OBJECTIVE:  Vitals:   02/16/20 0951  BP: (!) 144/88  Pulse: 71  Resp: 18  Temp: 98.2 F (36.8 C)  SpO2: 97%     General appearance: alert; well-appearing, nontoxic; speaking in full sentences and tolerating own secretions HEENT: NCAT; Ears: EACs clear, TMs pearly gray; Eyes: PERRL.  EOM grossly intact.Nose: nares patent without rhinorrhea, Throat: oropharynx clear, tonsils non erythematous or enlarged, uvula midline  Neck: supple without LAD Lungs: unlabored respirations, symmetrical air entry; cough: absent; no respiratory distress;  CTAB Heart: regular rate and rhythm.  Skin: warm and dry Psychological: alert and cooperative; normal mood and affect  ASSESSMENT & PLAN:  1. Exposure to COVID-19 virus   2. Close exposure to COVID-19 virus   3. Encounter for laboratory testing for COVID-19 virus     COVID testing ordered.  It will take between 5-7 days for test results.  Someone will contact you regarding abnormal results.    In the meantime: You should remain isolated in your home for 10 days from symptom onset AND greater than 72 hours after symptoms resolution (absence of fever without the use of fever-reducing medication and improvement in respiratory symptoms), whichever is longer OR 14 days from exposure Get plenty of rest and push fluids Use OTC zyrtec for nasal congestion, runny nose, and/or sore throat Use OTC flonase for nasal congestion and runny nose Use medications daily for symptom relief Use OTC medications like ibuprofen or tylenol as needed fever or pain Call or go to the ED if you have any new or worsening symptoms such as fever, cough, shortness of breath, chest tightness, chest pain, turning blue, changes in mental status, etc...   Reviewed expectations re: course of current medical issues. Questions answered. Outlined signs and symptoms indicating need for more acute intervention. Patient verbalized understanding. After Visit Summary given.         Lestine Box, PA-C 02/16/20 1022

## 2020-02-16 NOTE — Discharge Instructions (Signed)

## 2020-02-16 NOTE — ED Triage Notes (Signed)
pts wife tested positive for covid yesterday wants testing. No symptoms

## 2020-02-17 LAB — NOVEL CORONAVIRUS, NAA: SARS-CoV-2, NAA: NOT DETECTED

## 2020-02-23 ENCOUNTER — Ambulatory Visit: Payer: 59 | Attending: Internal Medicine

## 2020-02-23 ENCOUNTER — Other Ambulatory Visit: Payer: Self-pay

## 2020-02-23 DIAGNOSIS — Z23 Encounter for immunization: Secondary | ICD-10-CM

## 2020-02-23 NOTE — Progress Notes (Signed)
   Covid-19 Vaccination Clinic  Name:  Aaron Peck    MRN: [id number] DOB: 26-Mar-1961  02/23/2020  Aaron Peck was observed post Covid-19 immunization for 15 minutes without incident. He was provided with Vaccine Information Sheet and instruction to access the V-Safe system.   Aaron Peck was instructed to call 911 with any severe reactions post vaccine: Marland Kitchen Difficulty breathing  . Swelling of face and throat  . A fast heartbeat  . A bad rash all over body  . Dizziness and weakness   Immunizations Administered    Name Date Dose VIS Date Route   Moderna COVID-19 Vaccine 02/23/2020 10:24 AM 0.5 mL 11/13/2019 Intramuscular   Manufacturer: Moderna   Lot: [id number]   [id number]

## 2020-03-22 IMAGING — US US RENAL
1 series · 14 of 25 positions shown · non-contrast
Comparison: June 07, 2017

CLINICAL DATA: CKD stage 3

EXAM:
RENAL / URINARY TRACT ULTRASOUND COMPLETE

[Series 1: us renal · 14 of 57 slices shown]
[im 1/57]
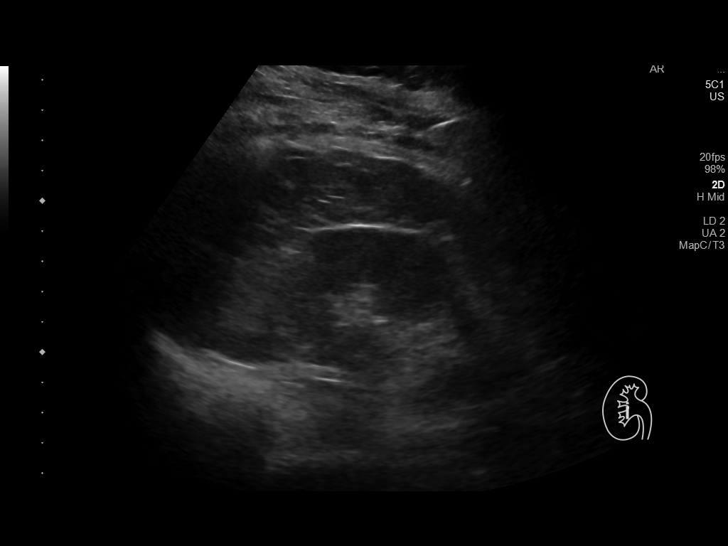
[im 5/57]
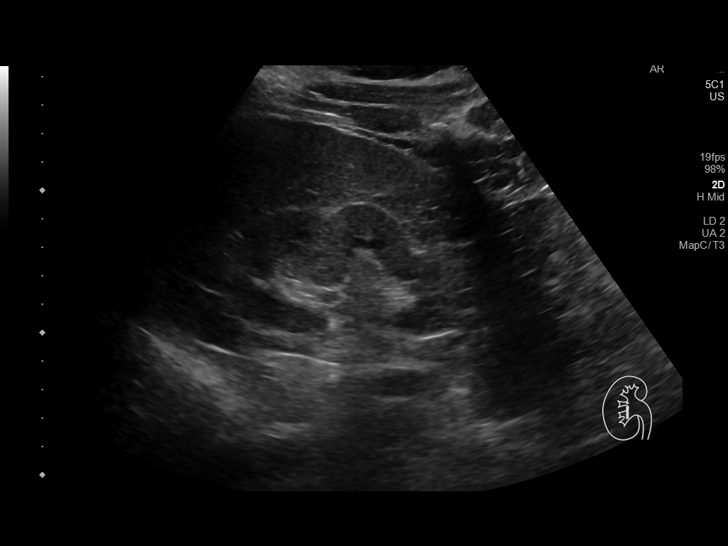
[im 10/57]
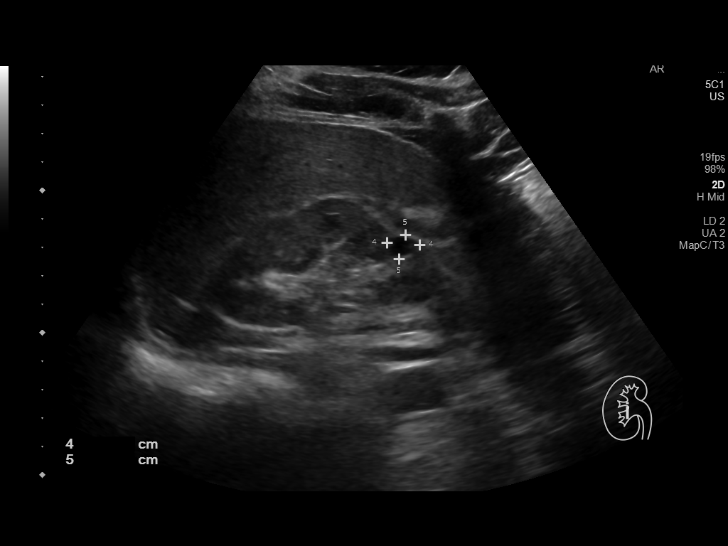
[im 15/57]
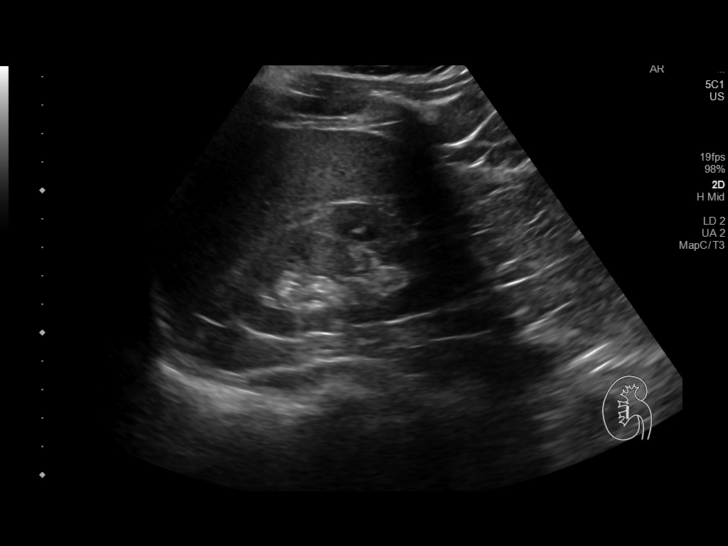
[im 19/57]
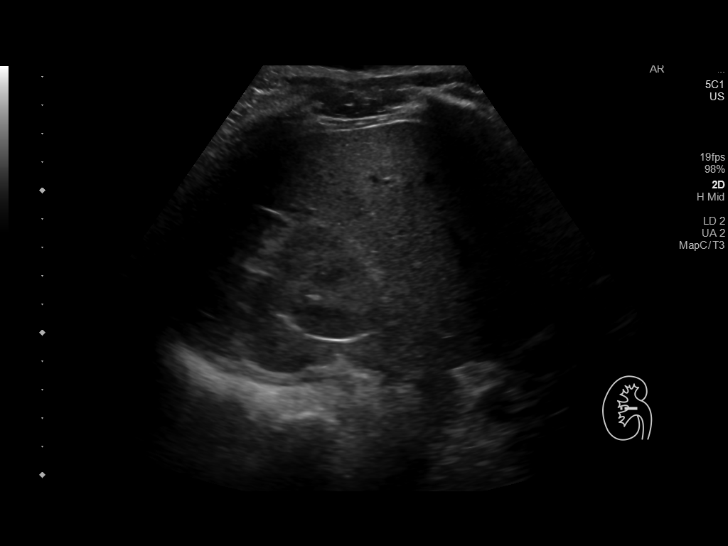
[im 22/57]
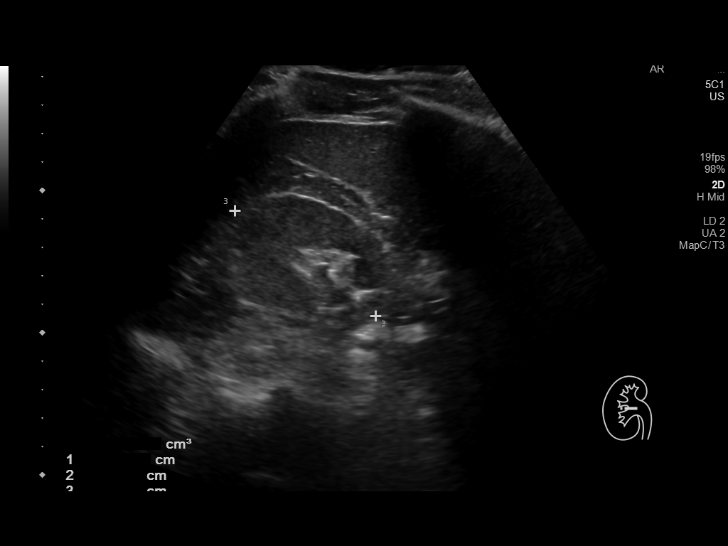
[im 26/57]
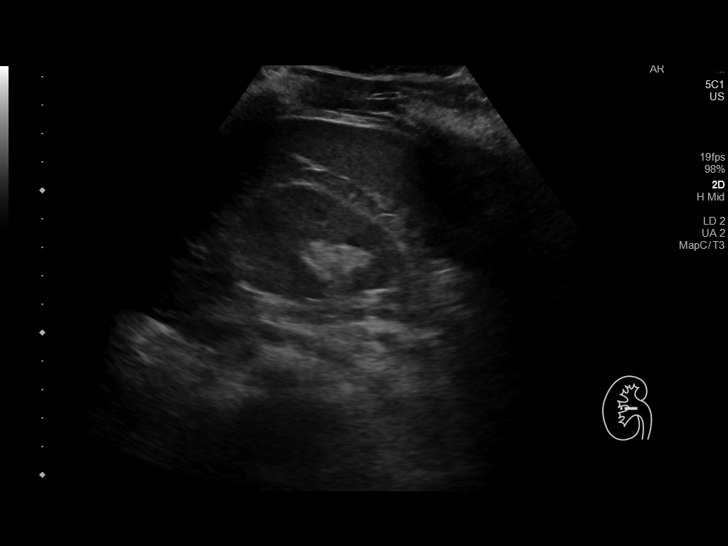
[im 31/57]
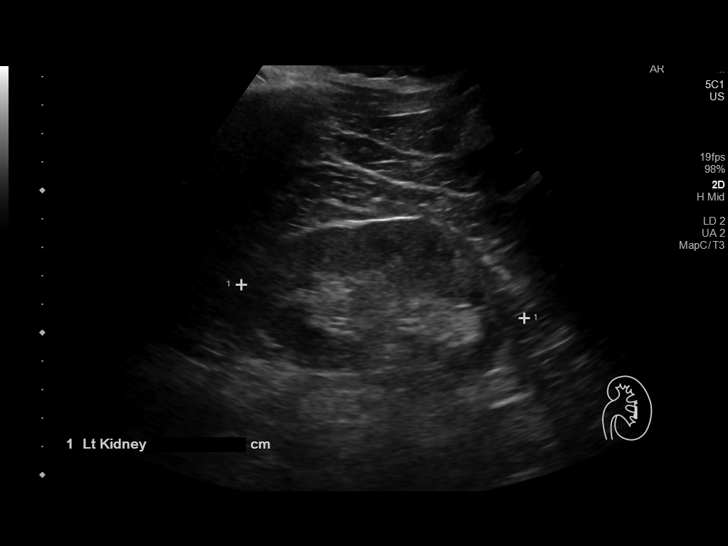
[im 36/57]
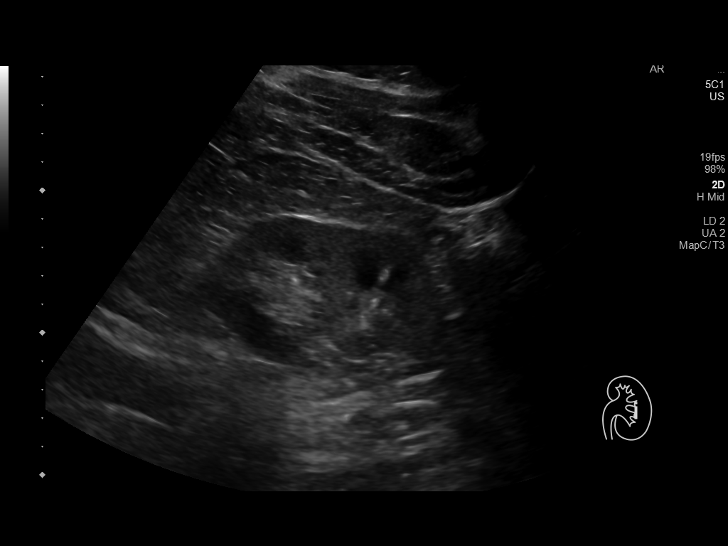
[im 38/57]
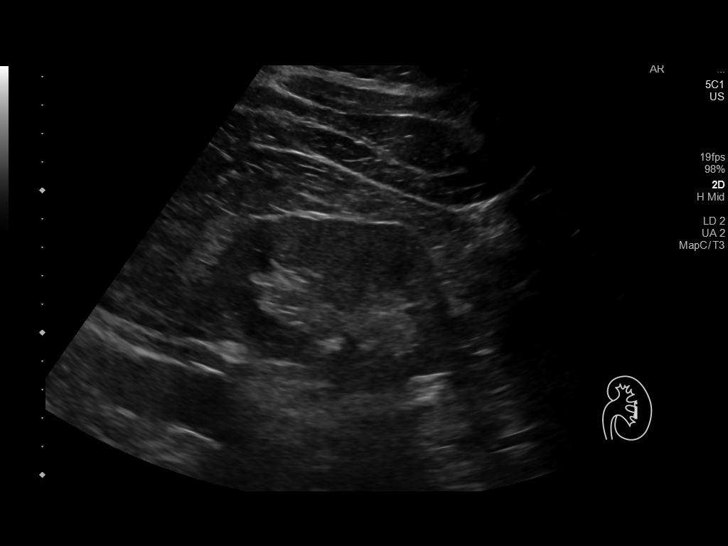
[im 43/57]
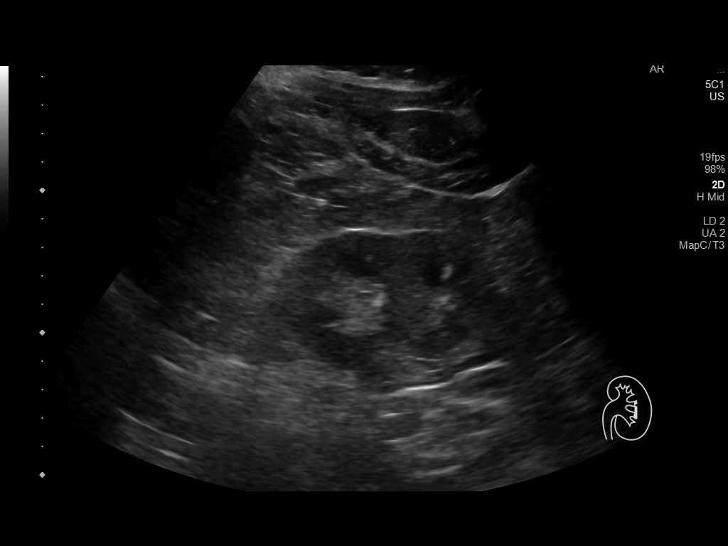
[im 47/57]
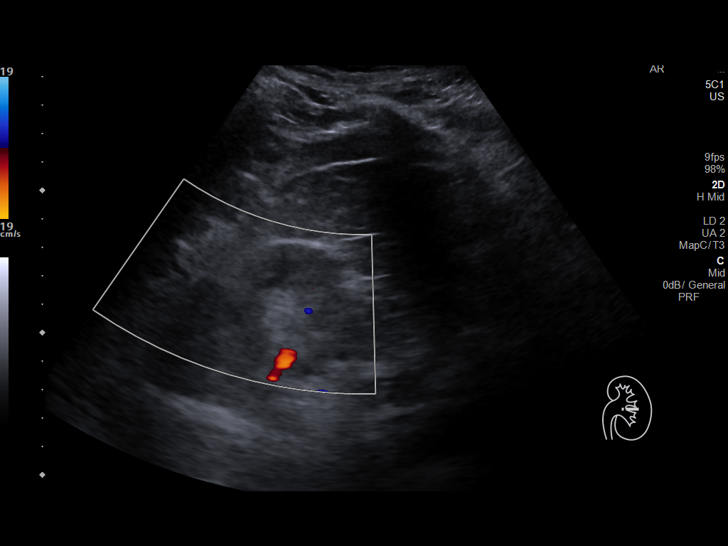
[im 52/57]
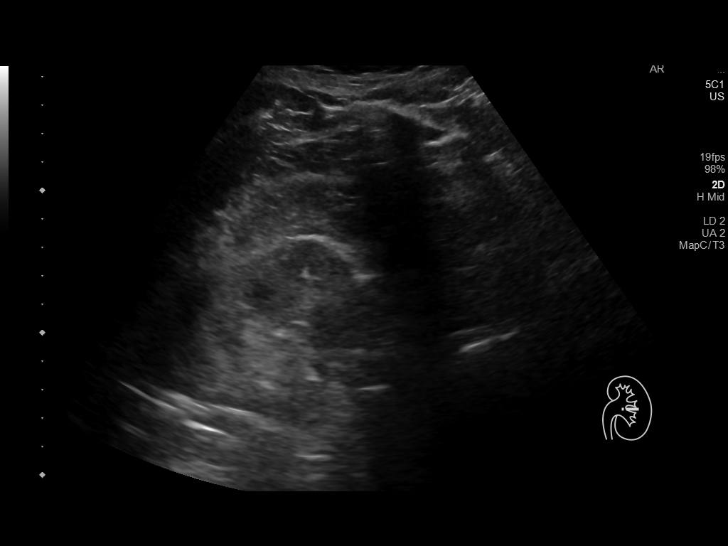
[im 57/57]
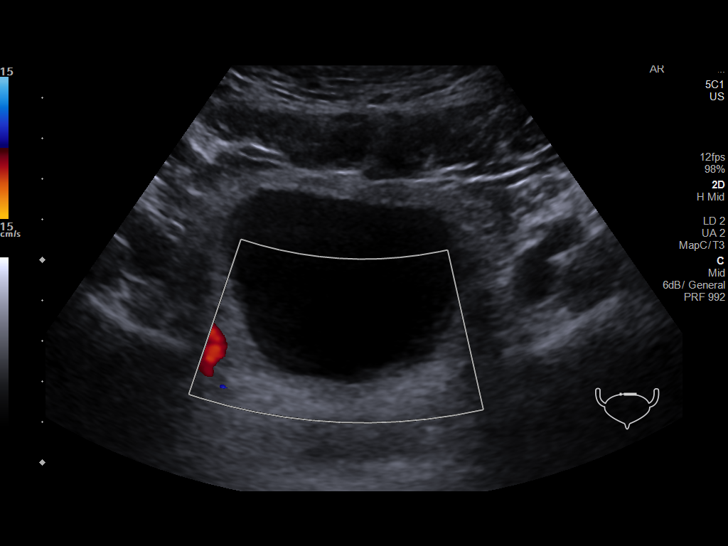

[14 of 25 positions shown; findings below may reference images not displayed]

FINDINGS: Right Kidney:

Renal measurements: 10.1 x 5.8 x 6.2 cm = volume: 188 mL. The
echogenicity is increased. There is no hydronephrosis. Small cysts
are noted measuring up to 1.2 cm

Left Kidney:

Renal measurements: 10.2 x 6.2 x 5 cm = volume: 165 mL. The
echogenicity is increased. A 4 mm stone is noted in the interpolar
region.

Bladder:

The ureteral jets were not visualized.
IMPRESSION: 1. No hydronephrosis.
2. Echogenic kidneys bilaterally which can be seen in patients with
medical renal disease.
3. Nonobstructing 4 mm stone in the interpolar region of the left
kidney.
4. The bilateral ureteral jets were not visualized on today's exam.

## 2020-03-26 ENCOUNTER — Ambulatory Visit: Payer: 59 | Attending: Internal Medicine

## 2020-03-26 DIAGNOSIS — Z23 Encounter for immunization: Secondary | ICD-10-CM

## 2020-03-26 NOTE — Progress Notes (Signed)
   Covid-19 Vaccination Clinic  Name:  Aaron Peck    MRN: [id number] DOB: 15-May-1961  03/26/2020  [name] was observed post Covid-19 immunization for 15 minutes without incident. He was provided with Vaccine Information Sheet and instruction to access the V-Safe system.   [name] was instructed to call 911 with any severe reactions post vaccine: Marland Kitchen Difficulty breathing  . Swelling of face and throat  . A fast heartbeat  . A bad rash all over body  . Dizziness and weakness   Immunizations Administered    Name Date Dose VIS Date Route   Moderna COVID-19 Vaccine 03/26/2020  9:34 AM 0.5 mL 11/13/2019 Intramuscular   Manufacturer: Moderna   Lot: [id number]   [id number]

## 2020-09-24 ENCOUNTER — Encounter (HOSPITAL_BASED_OUTPATIENT_CLINIC_OR_DEPARTMENT_OTHER): Payer: Self-pay

## 2020-09-24 DIAGNOSIS — R0683 Snoring: Secondary | ICD-10-CM

## 2020-10-20 ENCOUNTER — Encounter: Payer: Self-pay | Admitting: Internal Medicine

## 2020-10-23 ENCOUNTER — Other Ambulatory Visit: Payer: Self-pay

## 2020-10-23 ENCOUNTER — Ambulatory Visit: Payer: 59 | Attending: Nephrology | Admitting: Neurology

## 2020-10-23 DIAGNOSIS — D638 Anemia in other chronic diseases classified elsewhere: Secondary | ICD-10-CM | POA: Insufficient documentation

## 2020-10-23 DIAGNOSIS — G4733 Obstructive sleep apnea (adult) (pediatric): Secondary | ICD-10-CM | POA: Diagnosis not present

## 2020-10-23 DIAGNOSIS — N1832 Chronic kidney disease, stage 3b: Secondary | ICD-10-CM | POA: Diagnosis not present

## 2020-10-23 DIAGNOSIS — Z79899 Other long term (current) drug therapy: Secondary | ICD-10-CM | POA: Diagnosis not present

## 2020-10-23 DIAGNOSIS — N200 Calculus of kidney: Secondary | ICD-10-CM | POA: Diagnosis not present

## 2020-10-23 DIAGNOSIS — R0683 Snoring: Secondary | ICD-10-CM

## 2020-10-23 DIAGNOSIS — I129 Hypertensive chronic kidney disease with stage 1 through stage 4 chronic kidney disease, or unspecified chronic kidney disease: Secondary | ICD-10-CM | POA: Insufficient documentation

## 2020-10-23 DIAGNOSIS — R809 Proteinuria, unspecified: Secondary | ICD-10-CM | POA: Diagnosis not present

## 2020-11-02 NOTE — Procedures (Signed)
 [facility] Kofi A. Doonquah, MD     [email or web address]             NOCTURNAL POLYSOMNOGRAPHY   LOCATION: [facility]  Patient Name: Aaron Peck, Aaron Peck Study Date: 10/23/2020 Gender: Male D.O.B: 08/15/1961 Age (years): 59 Referring Provider: Manpreet Bhutani Height (inches): 64 Interpreting Physician: Kofi Doonquah MD, ABSM Weight (lbs): 205 RPSGT: Peak, Robert BMI: 35 MRN: [id number] Neck Size: 17.00 CLINICAL INFORMATION Sleep Study Type: Split Night CPAP     Indication for sleep study: [name]     Epworth Sleepiness Score:  SLEEP STUDY TECHNIQUE As per the AASM Manual for the Scoring of Sleep and Associated Events v2.3 (April 2016) with a hypopnea requiring 4% desaturations.  The channels recorded and monitored were frontal, central and occipital EEG, electrooculogram (EOG), submentalis EMG (chin), nasal and oral airflow, thoracic and abdominal wall motion, anterior tibialis EMG, snore microphone, electrocardiogram, and pulse oximetry. Continuous positive airway pressure (CPAP) was initiated when the patient met split night criteria and was titrated according to treat sleep-disordered breathing.  MEDICATIONS Medications self-administered by patient taken the night of the study : [name]  Current Outpatient Medications:  .  aspirin 81 MG tablet, Take 81 mg by mouth daily., Disp: , Rfl:  .  diltiazem (DILACOR XR) 120 MG 24 hr capsule, Take 120 mg by mouth daily., Disp: , Rfl:  .  losartan-hydrochlorothiazide (HYZAAR) 100-12.5 MG tablet, Take 1 tablet by mouth daily., Disp: , Rfl:  .  Multiple Vitamin (MULTIVITAMIN) capsule, Take 1 capsule by mouth daily., Disp: , Rfl:  .  omeprazole (PRILOSEC) 20 MG capsule, Take 20 mg by mouth daily., Disp: , Rfl:    RESPIRATORY PARAMETERS Diagnostic  Total AHI (/hr): 24.9 RDI (/hr): 25.1 OA Index (/hr): 9.3 CA Index (/hr): 6.2 REM AHI (/hr): 59.4 NREM AHI (/hr): 16.5 Supine AHI (/hr): 88.2 Non-supine AHI (/hr): 18.7 Min  O2 Sat (%): 75.0 Mean O2 (%): 91.7 Time below 88% (min): 19.1   Titration  Optimal Pressure (cm): 7 AHI at Optimal Pressure (/hr): 0.5 Min O2 at Optimal Pressure (%): 90.0 Supine % at Optimal (%): 0 Sleep % at Optimal (%): 97   SLEEP ARCHITECTURE The recording time for the entire night was 500 minutes.  During a baseline period of 349.4 minutes, the patient slept for 272.0 minutes in REM and nonREM, yielding a sleep efficiency of 77.8%%. Sleep onset after lights out was 7.9 minutes with a REM latency of 76.0 minutes. The patient spent 1.7%% of the night in stage N1 sleep, 64.3%% in stage N2 sleep, 14.3%% in stage N3 and 19.7% in REM.     During the titration period of 150.1 minutes, the patient slept for 139.1 minutes in REM and nonREM, yielding a sleep efficiency of 92.7%%. Sleep onset after CPAP initiation was 7.0 minutes with a REM latency of 78.5 minutes. The patient spent 1.1%% of the night in stage N1 sleep, 86.0%% in stage N2 sleep, 2.2%% in stage N3 and 10.8% in REM.  CARDIAC DATA The 2 lead EKG demonstrated sinus rhythm. The mean heart rate was 100.0 beats per minute. Other EKG findings include: PVCs. LEG MOVEMENT DATA The total Periodic Limb Movements of Sleep (PLMS) were 0. The PLMS index was 0.0.  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 The optimal CPAP selected for this patient is ( 7 cm of water)   Kofi A Doonquah, MD Diplomate, American Board of Sleep Medicine.  ELECTRONICALLY SIGNED   ON:  11/02/2020, 4:04 PM [place] PH: [phone]   FX: [phone] [place]

## 2020-11-27 ENCOUNTER — Other Ambulatory Visit: Payer: Self-pay

## 2020-11-27 ENCOUNTER — Ambulatory Visit (INDEPENDENT_AMBULATORY_CARE_PROVIDER_SITE_OTHER): Payer: 59 | Admitting: Internal Medicine

## 2020-11-27 ENCOUNTER — Encounter: Payer: Self-pay | Admitting: Internal Medicine

## 2020-11-27 VITALS — BP 194/106 | HR 75 | Temp 96.8°F | Ht 64.0 in | Wt 211.0 lb

## 2020-11-27 DIAGNOSIS — D509 Iron deficiency anemia, unspecified: Secondary | ICD-10-CM

## 2020-11-27 DIAGNOSIS — K219 Gastro-esophageal reflux disease without esophagitis: Secondary | ICD-10-CM

## 2020-11-27 DIAGNOSIS — R195 Other fecal abnormalities: Secondary | ICD-10-CM | POA: Diagnosis not present

## 2020-11-27 NOTE — Progress Notes (Signed)
Primary Care Physician:  Rosita Fire, MD Primary Gastroenterologist:  Dr. Abbey Chatters  Chief Complaint  Patient presents with  . Anemia    HPI:   Aaron Peck is a 59 y.o. male who presents to the clinic today by referral from his nephrologist Dr. [name] for evaluation.  Recent blood work showed patient is iron deficient as well as slight anemia with hemoglobin 12.8.  According to his chart he is also had a heme positive stool in the past.  Last colonoscopy in 2015 largely unremarkable besides one small polyp which was removed, pathology benign.  No previous upper endoscopy.  Does note chronic reflux which is well controlled on omeprazole.  No chest pain or other alarm symptoms.  No dysphagia or odynophagia.  No chronic NSAID use.  No history of PUD or H. pylori that he knows of.  Does have hypertension and his blood pressure is elevated today in clinic.  He states he has not taken his blood pressure medications today.  Denies any chest pain or headaches.  Past Medical History:  Diagnosis Date  . GERD (gastroesophageal reflux disease)   . Hypertension     Past Surgical History:  Procedure Laterality Date  . COLONOSCOPY     can not remeber when but it has been awhile, at least 5 years ago  . COLONOSCOPY [name] 03/12/2014   Procedure: COLONOSCOPY;  Surgeon: Danie Binder, MD;  [facility]: [facility];  Service: Endoscopy;  Laterality: [name];  10:00    Current Outpatient Medications  Medication Sig Dispense Refill  . aspirin 81 MG tablet Take 81 mg by mouth daily.    . carvedilol (COREG) 6.25 MG tablet Take 6.25 mg by mouth 2 (two) times daily with a meal.    . Cholecalciferol (VITAMIN D3) 25 MCG (1000 UT) CAPS Take by mouth daily.    Marland Kitchen diltiazem (DILACOR XR) 120 MG 24 hr capsule Take 360 mg by mouth daily. Takes one tablet 360 mg daily.    Marland Kitchen losartan-hydrochlorothiazide (HYZAAR) 100-12.5 MG tablet Take 1 tablet by mouth daily.    . Multiple Vitamin (MULTIVITAMIN) capsule Take 1  capsule by mouth daily.    Marland Kitchen omeprazole (PRILOSEC) 20 MG capsule Take 20 mg by mouth daily.     No current facility-administered medications for this visit.    Allergies as of 11/27/2020  . (No Known Allergies)    Family History  Problem Relation Age of Onset  . Hypertension Mother   . Cancer Father   . Colon cancer Neg Hx     Social History   Socioeconomic History  . Marital status: Married    Spouse name: Not on file  . Number of children: Not on file  . Years of education: Not on file  . Highest education level: Not on file  Occupational History  . Occupation: Technical brewer: [employer]  Tobacco Use  . Smoking status: Never Smoker  . Smokeless tobacco: Never Used  Substance and Sexual Activity  . Alcohol use: No  . Drug use: No  . Sexual activity: Not on file  Other Topics Concern  . Not on file  Social History Narrative  . Not on file   Social Determinants of Health   Financial Resource Strain: Not on file  Food Insecurity: Not on file  Transportation Needs: Not on file  Physical Activity: Not on file  Stress: Not on file  Social Connections: Not on file  Intimate Partner Violence: Not on file  Subjective: Review of Systems  Constitutional: Negative for chills and fever.  HENT: Negative for congestion and hearing loss.   Eyes: Negative for blurred vision and double vision.  Respiratory: Negative for cough and shortness of breath.   Cardiovascular: Negative for chest pain and palpitations.  Gastrointestinal: Negative for abdominal pain, blood in stool, constipation, diarrhea, heartburn, melena and vomiting.  Genitourinary: Negative for dysuria and urgency.  Musculoskeletal: Negative for joint pain and myalgias.  Skin: Negative for itching and rash.  Neurological: Negative for dizziness and headaches.  Psychiatric/Behavioral: Negative for depression. The patient is not nervous/anxious.        Objective: BP (!) 194/106   Pulse 75   Temp (!)  96.8 F (36 C) (Temporal)   Ht 5\' 4"  (1.626 m)   Wt 211 lb (95.7 kg)   BMI 36.22 kg/m  Physical Exam Constitutional:      Appearance: Normal appearance.  HENT:     Head: Normocephalic and atraumatic.  Eyes:     Extraocular Movements: Extraocular movements intact.     Conjunctiva/sclera: Conjunctivae normal.  Cardiovascular:     Rate and Rhythm: Normal rate and regular rhythm.  Pulmonary:     Effort: Pulmonary effort is normal.     Breath sounds: Normal breath sounds.  Abdominal:     General: Bowel sounds are normal.     Palpations: Abdomen is soft.  Musculoskeletal:        General: Normal range of motion.     Cervical back: Normal range of motion and neck supple.  Skin:    General: Skin is warm.  Neurological:     General: No focal deficit present.     Mental Status: He is alert and oriented to person, place, and time.  Psychiatric:        Mood and Affect: Mood normal.        Behavior: Behavior normal.      Assessment: *Iron deficiency anemia *Heme positive stool *Chronic reflux-well-controlled on omeprazole  Plan: Etiology of patient's chronic iron deficiency anemia unclear.  Occult GI blood loss is certainly on the differential especially given his heme positive stool.  Will schedule for EGD to evaluate for peptic ulcer disease, esophagitis, gastritis, H. Pylori, duodenitis, or other. Will also evaluate for esophageal stricture, Schatzki's ring, esophageal web or other.   At the same time we will perform colonoscopy to rule out blood loss from hemorrhoids, diverticulosis, AVMs, polyps, malignancy, or other.  The risks including infection, bleed, or perforation as well as benefits, limitations, alternatives and imponderables have been reviewed with the patient. Potential for esophageal dilation, biopsy, etc. have also been reviewed.  Questions have been answered. All parties agreeable.  Continue on daily omeprazole for chronic reflux which is currently well  controlled.  No alarm symptoms.  No dysphagia/odynophagia.  Thank you Dr. [name] for the kind referral.   11/27/2020 10:48 AM   Disclaimer: This note was dictated with voice recognition software. Similar sounding words can inadvertently be transcribed and may not be corrected upon review.

## 2020-11-27 NOTE — Patient Instructions (Signed)
We will schedule you for EGD and colonoscopy to further evaluate your iron deficiency anemia as well as heme positive stool.  Further recommendations to follow.  At [facility] we value your feedback. You may receive a survey about your visit today. Please share your experience as we strive to create trusting relationships with our patients to provide genuine, compassionate, quality care.  We appreciate your understanding and patience as we review any laboratory studies, imaging, and other diagnostic tests that are ordered as we care for you. Our office policy is 5 business days for review of these results, and any emergent or urgent results are addressed in a timely manner for your best interest. If you do not hear from our office in 1 week, please contact us.   We also encourage the use of MyChart, which contains your medical information for your review as well. If you are not enrolled in this feature, an access code is on this after visit summary for your convenience. Thank you for allowing [place] to be involved in your care.  It was great to see you today!  I hope you have a great rest of your winter!!    Elon Alas. Abbey Chatters, D.O. Gastroenterology and Hepatology [facility]

## 2020-12-18 ENCOUNTER — Telehealth: Payer: Self-pay

## 2020-12-18 NOTE — Telephone Encounter (Signed)
Per [facility] website, pt doesn't have active coverage.  Tried to call pt, phone doesn't ring.

## 2021-01-14 NOTE — Patient Instructions (Signed)
Aaron Peck.  01/14/2021     @PREFPERIOPPHARMACY @   Your procedure is scheduled on  01/20/2021   Report to [facility] at  0700  A.M.   Call this number if you have problems the morning of surgery:  [phone]     Remember:  Follow the diet and prep instructions given to you by the office.                    Take these medicines the morning of surgery with A SIP OF WATER              Carvedilol,diltiazem, prilosec.   Please brush your teeth.  Do not wear jewelry, make-up or nail polish.  Do not wear lotions, powders, or perfumes, or deodorant.  Do not shave 48 hours prior to surgery.  Men may shave face and neck.  Do not bring valuables to the hospital.  [facility] is not responsible for any belongings or valuables.  Contacts, dentures or bridgework may not be worn into surgery.  Leave your suitcase in the car.  After surgery it may be brought to your room.  For patients admitted to the hospital, discharge time will be determined by your treatment team.  Patients discharged the day of surgery will not be allowed to drive home and must have someone with them for 24 hours.   Special instructions:   DO NOT smoke tobacco or vape the morning of your procedure.  Please read over the following fact sheets that you were given. Anesthesia Post-op Instructions and Care and Recovery After Surgery         Upper Endoscopy, Adult, Care After This sheet gives you information about how to care for yourself after your procedure. Your health care provider may also give you more specific instructions. If you have problems or questions, contact your health care provider. What can I expect after the procedure? After the procedure, it is common to have:  A sore throat.  Mild stomach pain or discomfort.  Bloating.  Nausea. Follow these instructions at home:  Follow instructions from your health care provider about what to eat or drink after your procedure.  Return  to your normal activities as told by your health care provider. Ask your health care provider what activities are safe for you.  Take over-the-counter and prescription medicines only as told by your health care provider.  If you were given a sedative during the procedure, it can affect you for several hours. Do not drive or operate machinery until your health care provider says that it is safe.  Keep all follow-up visits as told by your health care provider. This is important.   Contact a health care provider if you have:  A sore throat that lasts longer than one day.  Trouble swallowing. Get help right away if:  You vomit blood or your vomit looks like coffee grounds.  You have: ? A fever. ? Bloody, black, or tarry stools. ? A severe sore throat or you cannot swallow. ? Difficulty breathing. ? Severe pain in your chest or abdomen. Summary  After the procedure, it is common to have a sore throat, mild stomach discomfort, bloating, and nausea.  If you were given a sedative during the procedure, it can affect you for several hours. Do not drive or operate machinery until your health care provider says that it is safe.  Follow instructions from your health care provider about what to  eat or drink after your procedure.  Return to your normal activities as told by your health care provider. This information is not intended to replace advice given to you by your health care provider. Make sure you discuss any questions you have with your health care provider. Document Revised: 11/27/2019 Document Reviewed: 05/01/2018 Elsevier Patient Education  2021 [place].  Colonoscopy, Adult, Care After This sheet gives you information about how to care for yourself after your procedure. Your health care provider may also give you more specific instructions. If you have problems or questions, contact your health care provider. What can I expect after the procedure? After the procedure, it is  common to have:  A small amount of blood in your stool for 24 hours after the procedure.  Some gas.  Mild cramping or bloating of your abdomen. Follow these instructions at home: Eating and drinking  Drink enough fluid to keep your urine pale yellow.  Follow instructions from your health care provider about eating or drinking restrictions.  Resume your normal diet as instructed by your health care provider. Avoid heavy or fried foods that are hard to digest.   Activity  Rest as told by your health care provider.  Avoid sitting for a long time without moving. Get up to take short walks every 1-2 hours. This is important to improve blood flow and breathing. Ask for help if you feel weak or unsteady.  Return to your normal activities as told by your health care provider. Ask your health care provider what activities are safe for you. Managing cramping and bloating  Try walking around when you have cramps or feel bloated.  Apply heat to your abdomen as told by your health care provider. Use the heat source that your health care provider recommends, such as a moist heat pack or a heating pad. ? Place a towel between your skin and the heat source. ? Leave the heat on for 20-30 minutes. ? Remove the heat if your skin turns bright red. This is especially important if you are unable to feel pain, heat, or cold. You may have a greater risk of getting burned.   General instructions  If you were given a sedative during the procedure, it can affect you for several hours. Do not drive or operate machinery until your health care provider says that it is safe.  For the first 24 hours after the procedure: ? Do not sign important documents. ? Do not drink alcohol. ? Do your regular daily activities at a slower pace than normal. ? Eat soft foods that are easy to digest.  Take over-the-counter and prescription medicines only as told by your health care provider.  Keep all follow-up visits as  told by your health care provider. This is important. Contact a health care provider if:  You have blood in your stool 2-3 days after the procedure. Get help right away if you have:  More than a small spotting of blood in your stool.  Large blood clots in your stool.  Swelling of your abdomen.  Nausea or vomiting.  A fever.  Increasing pain in your abdomen that is not relieved with medicine. Summary  After the procedure, it is common to have a small amount of blood in your stool. You may also have mild cramping and bloating of your abdomen.  If you were given a sedative during the procedure, it can affect you for several hours. Do not drive or operate machinery until your health care  provider says that it is safe.  Get help right away if you have a lot of blood in your stool, nausea or vomiting, a fever, or increased pain in your abdomen. This information is not intended to replace advice given to you by your health care provider. Make sure you discuss any questions you have with your health care provider. Document Revised: 11/23/2019 Document Reviewed: 06/25/2019 Elsevier Patient Education  2021 [place] After This sheet gives you information about how to care for yourself after your procedure. Your health care provider may also give you more specific instructions. If you have problems or questions, contact your health care provider. What can I expect after the procedure? After the procedure, it is common to have:  Tiredness.  Forgetfulness about what happened after the procedure.  Impaired judgment for important decisions.  Nausea or vomiting.  Some difficulty with balance. Follow these instructions at home: For the time period you were told by your health care provider:  Rest as needed.  Do not participate in activities where you could fall or become injured.  Do not drive or use machinery.  Do not drink alcohol.  Do not  take sleeping pills or medicines that cause drowsiness.  Do not make important decisions or sign legal documents.  Do not take care of children on your own.      Eating and drinking  Follow the diet that is recommended by your health care provider.  Drink enough fluid to keep your urine pale yellow.  If you vomit: ? Drink water, juice, or soup when you can drink without vomiting. ? Make sure you have little or no nausea before eating solid foods. General instructions  Have a responsible adult stay with you for the time you are told. It is important to have someone help care for you until you are awake and alert.  Take over-the-counter and prescription medicines only as told by your health care provider.  If you have sleep apnea, surgery and certain medicines can increase your risk for breathing problems. Follow instructions from your health care provider about wearing your sleep device: ? Anytime you are sleeping, including during daytime naps. ? While taking prescription pain medicines, sleeping medicines, or medicines that make you drowsy.  Avoid smoking.  Keep all follow-up visits as told by your health care provider. This is important. Contact a health care provider if:  You keep feeling nauseous or you keep vomiting.  You feel light-headed.  You are still sleepy or having trouble with balance after 24 hours.  You develop a rash.  You have a fever.  You have redness or swelling around the IV site. Get help right away if:  You have trouble breathing.  You have new-onset confusion at home. Summary  For several hours after your procedure, you may feel tired. You may also be forgetful and have poor judgment.  Have a responsible adult stay with you for the time you are told. It is important to have someone help care for you until you are awake and alert.  Rest as told. Do not drive or operate machinery. Do not drink alcohol or take sleeping pills.  Get help right  away if you have trouble breathing, or if you suddenly become confused. This information is not intended to replace advice given to you by your health care provider. Make sure you discuss any questions you have with your health care provider. Document Revised: 08/14/2020 Document Reviewed: 11/01/2019 Elsevier Patient  Education  2021 [employer].

## 2021-01-15 ENCOUNTER — Encounter: Payer: Self-pay | Admitting: *Deleted

## 2021-01-15 ENCOUNTER — Telehealth: Payer: Self-pay

## 2021-01-15 NOTE — Telephone Encounter (Signed)
Spoke to pt, procedure moved to 02/24/21--AM. Endo scheduler informed.

## 2021-01-15 NOTE — Telephone Encounter (Signed)
TCS/EGD w/Propofol w/Dr. Abbey Chatters ASA 3 for 2/8 has to be rescheduled per endo.  Tried to call pt, LMOVM for return call.

## 2021-01-16 ENCOUNTER — Encounter (HOSPITAL_COMMUNITY)
Admission: RE | Admit: 2021-01-16 | Discharge: 2021-01-16 | Disposition: A | Discharge status: Home / Self Care | Source: Ambulatory Visit | Attending: Internal Medicine | Admitting: Internal Medicine

## 2021-01-16 ENCOUNTER — Other Ambulatory Visit (HOSPITAL_COMMUNITY): Admission: RE | Admit: 2021-01-16 | Source: Ambulatory Visit

## 2021-02-19 NOTE — Patient Instructions (Signed)
Aaron Peck.  02/19/2021     @PREFPERIOPPHARMACY @   Your procedure is scheduled on  02/24/2021   Report to [name] at  60  A.M.   Call this number if you have problems the morning of surgery:  [phone]   Remember:  Follow the diet and prep instructions given to you by the office.                   Take these medicines the morning of surgery with A SIP OF WATER  Carvedilol, diltiazem, prilosec.    Please brush your teeth.  Do not wear jewelry, make-up or nail polish.  Do not wear lotions, powders, or perfumes, or deodorant.  Do not shave 48 hours prior to surgery.  Men may shave face and neck.  Do not bring valuables to the hospital.  [facility] is not responsible for any belongings or valuables.  Contacts, dentures or bridgework may not be worn into surgery.  Leave your suitcase in the car.  After surgery it may be brought to your room.  For patients admitted to the hospital, discharge time will be determined by your treatment team.  Patients discharged the day of surgery will not be allowed to drive home and must have someone with them for 24 hours.    Special instructions:  DO NOT smoke tobacco or vape the morning of your procedure.   Please read over the following fact sheets that you were given. Anesthesia Post-op Instructions and Care and Recovery After Surgery       Upper Endoscopy, Adult, Care After This sheet gives you information about how to care for yourself after your procedure. Your health care provider may also give you more specific instructions. If you have problems or questions, contact your health care provider. What can I expect after the procedure? After the procedure, it is common to have:  A sore throat.  Mild stomach pain or discomfort.  Bloating.  Nausea. Follow these instructions at home:  Follow instructions from your health care provider about what to eat or drink after your procedure.  Return to  your normal activities as told by your health care provider. Ask your health care provider what activities are safe for you.  Take over-the-counter and prescription medicines only as told by your health care provider.  If you were given a sedative during the procedure, it can affect you for several hours. Do not drive or operate machinery until your health care provider says that it is safe.  Keep all follow-up visits as told by your health care provider. This is important.   Contact a health care provider if you have:  A sore throat that lasts longer than one day.  Trouble swallowing. Get help right away if:  You vomit blood or your vomit looks like coffee grounds.  You have: ? A fever. ? Bloody, black, or tarry stools. ? A severe sore throat or you cannot swallow. ? Difficulty breathing. ? Severe pain in your chest or abdomen. Summary  After the procedure, it is common to have a sore throat, mild stomach discomfort, bloating, and nausea.  If you were given a sedative during the procedure, it can affect you for several hours. Do not drive or operate machinery until your health care provider says that it is safe.  Follow instructions from your health care provider about what to eat or drink after your procedure.  Return to your  normal activities as told by your health care provider. This information is not intended to replace advice given to you by your health care provider. Make sure you discuss any questions you have with your health care provider. Document Revised: 11/27/2019 Document Reviewed: 05/01/2018 Elsevier Patient Education  2021 [place].  Colonoscopy, Adult, Care After This sheet gives you information about how to care for yourself after your procedure. Your health care provider may also give you more specific instructions. If you have problems or questions, contact your health care provider. What can I expect after the procedure? After the procedure, it is  common to have:  A small amount of blood in your stool for 24 hours after the procedure.  Some gas.  Mild cramping or bloating of your abdomen. Follow these instructions at home: Eating and drinking  Drink enough fluid to keep your urine pale yellow.  Follow instructions from your health care provider about eating or drinking restrictions.  Resume your normal diet as instructed by your health care provider. Avoid heavy or fried foods that are hard to digest.   Activity  Rest as told by your health care provider.  Avoid sitting for a long time without moving. Get up to take short walks every 1-2 hours. This is important to improve blood flow and breathing. Ask for help if you feel weak or unsteady.  Return to your normal activities as told by your health care provider. Ask your health care provider what activities are safe for you. Managing cramping and bloating  Try walking around when you have cramps or feel bloated.  Apply heat to your abdomen as told by your health care provider. Use the heat source that your health care provider recommends, such as a moist heat pack or a heating pad. ? Place a towel between your skin and the heat source. ? Leave the heat on for 20-30 minutes. ? Remove the heat if your skin turns bright red. This is especially important if you are unable to feel pain, heat, or cold. You may have a greater risk of getting burned.   General instructions  If you were given a sedative during the procedure, it can affect you for several hours. Do not drive or operate machinery until your health care provider says that it is safe.  For the first 24 hours after the procedure: ? Do not sign important documents. ? Do not drink alcohol. ? Do your regular daily activities at a slower pace than normal. ? Eat soft foods that are easy to digest.  Take over-the-counter and prescription medicines only as told by your health care provider.  Keep all follow-up visits as  told by your health care provider. This is important. Contact a health care provider if:  You have blood in your stool 2-3 days after the procedure. Get help right away if you have:  More than a small spotting of blood in your stool.  Large blood clots in your stool.  Swelling of your abdomen.  Nausea or vomiting.  A fever.  Increasing pain in your abdomen that is not relieved with medicine. Summary  After the procedure, it is common to have a small amount of blood in your stool. You may also have mild cramping and bloating of your abdomen.  If you were given a sedative during the procedure, it can affect you for several hours. Do not drive or operate machinery until your health care provider says that it is safe.  Get help right  away if you have a lot of blood in your stool, nausea or vomiting, a fever, or increased pain in your abdomen. This information is not intended to replace advice given to you by your health care provider. Make sure you discuss any questions you have with your health care provider. Document Revised: 11/23/2019 Document Reviewed: 06/25/2019 Elsevier Patient Education  2021 [place] After This sheet gives you information about how to care for yourself after your procedure. Your health care provider may also give you more specific instructions. If you have problems or questions, contact your health care provider. What can I expect after the procedure? After the procedure, it is common to have:  Tiredness.  Forgetfulness about what happened after the procedure.  Impaired judgment for important decisions.  Nausea or vomiting.  Some difficulty with balance. Follow these instructions at home: For the time period you were told by your health care provider:  Rest as needed.  Do not participate in activities where you could fall or become injured.  Do not drive or use machinery.  Do not drink alcohol.  Do not  take sleeping pills or medicines that cause drowsiness.  Do not make important decisions or sign legal documents.  Do not take care of children on your own.      Eating and drinking  Follow the diet that is recommended by your health care provider.  Drink enough fluid to keep your urine pale yellow.  If you vomit: ? Drink water, juice, or soup when you can drink without vomiting. ? Make sure you have little or no nausea before eating solid foods. General instructions  Have a responsible adult stay with you for the time you are told. It is important to have someone help care for you until you are awake and alert.  Take over-the-counter and prescription medicines only as told by your health care provider.  If you have sleep apnea, surgery and certain medicines can increase your risk for breathing problems. Follow instructions from your health care provider about wearing your sleep device: ? Anytime you are sleeping, including during daytime naps. ? While taking prescription pain medicines, sleeping medicines, or medicines that make you drowsy.  Avoid smoking.  Keep all follow-up visits as told by your health care provider. This is important. Contact a health care provider if:  You keep feeling nauseous or you keep vomiting.  You feel light-headed.  You are still sleepy or having trouble with balance after 24 hours.  You develop a rash.  You have a fever.  You have redness or swelling around the IV site. Get help right away if:  You have trouble breathing.  You have new-onset confusion at home. Summary  For several hours after your procedure, you may feel tired. You may also be forgetful and have poor judgment.  Have a responsible adult stay with you for the time you are told. It is important to have someone help care for you until you are awake and alert.  Rest as told. Do not drive or operate machinery. Do not drink alcohol or take sleeping pills.  Get help right  away if you have trouble breathing, or if you suddenly become confused. This information is not intended to replace advice given to you by your health care provider. Make sure you discuss any questions you have with your health care provider. Document Revised: 08/14/2020 Document Reviewed: 11/01/2019 Elsevier Patient Education  2021 [employer].

## 2021-02-20 ENCOUNTER — Other Ambulatory Visit: Payer: Self-pay

## 2021-02-20 ENCOUNTER — Encounter (HOSPITAL_COMMUNITY)
Admission: RE | Admit: 2021-02-20 | Discharge: 2021-02-20 | Disposition: A | Discharge status: Home / Self Care | Source: Ambulatory Visit | Attending: Internal Medicine | Admitting: Internal Medicine

## 2021-02-20 ENCOUNTER — Encounter (HOSPITAL_COMMUNITY): Payer: Self-pay

## 2021-02-20 ENCOUNTER — Other Ambulatory Visit (HOSPITAL_COMMUNITY)
Admission: RE | Admit: 2021-02-20 | Discharge: 2021-02-20 | Disposition: A | Discharge status: Home / Self Care | Source: Ambulatory Visit | Attending: Internal Medicine | Admitting: Internal Medicine

## 2021-02-20 DIAGNOSIS — Z20822 Contact with and (suspected) exposure to covid-19: Secondary | ICD-10-CM | POA: Insufficient documentation

## 2021-02-20 DIAGNOSIS — Z01818 Encounter for other preprocedural examination: Secondary | ICD-10-CM | POA: Insufficient documentation

## 2021-02-20 HISTORY — DX: Sleep apnea, unspecified: G47.30

## 2021-02-20 LAB — BASIC METABOLIC PANEL
Anion gap: 15 (ref 5–15)
BUN: 23 mg/dL — ABNORMAL HIGH (ref 6–20)
CO2: 17 mmol/L — ABNORMAL LOW (ref 22–32)
Calcium: 9 mg/dL (ref 8.9–10.3)
Chloride: 108 mmol/L (ref 98–111)
Creatinine, Ser: 1.9 mg/dL — ABNORMAL HIGH (ref 0.61–1.24)
GFR, Estimated: 40 mL/min — ABNORMAL LOW (ref >60)
Glucose, Bld: 95 mg/dL (ref 70–99)
Potassium: 4.2 mmol/L (ref 3.5–5.1)
Sodium: 140 mmol/L (ref 135–145)

## 2021-02-21 LAB — SARS CORONAVIRUS 2 (TAT 6-24 HRS): SARS Coronavirus 2: NEGATIVE

## 2021-02-24 ENCOUNTER — Ambulatory Visit (HOSPITAL_COMMUNITY): Admitting: Anesthesiology

## 2021-02-24 ENCOUNTER — Encounter (HOSPITAL_COMMUNITY)
Admission: RE | Disposition: A | Discharge status: Home / Self Care | Payer: Self-pay | Source: Home / Self Care | Attending: Internal Medicine

## 2021-02-24 ENCOUNTER — Encounter (HOSPITAL_COMMUNITY): Payer: Self-pay

## 2021-02-24 ENCOUNTER — Ambulatory Visit (HOSPITAL_COMMUNITY)
Admission: RE | Admit: 2021-02-24 | Discharge: 2021-02-24 | Disposition: A | Discharge status: Home / Self Care | Attending: Internal Medicine | Admitting: Internal Medicine

## 2021-02-24 ENCOUNTER — Other Ambulatory Visit: Payer: Self-pay

## 2021-02-24 DIAGNOSIS — D509 Iron deficiency anemia, unspecified: Secondary | ICD-10-CM | POA: Insufficient documentation

## 2021-02-24 DIAGNOSIS — Z7982 Long term (current) use of aspirin: Secondary | ICD-10-CM | POA: Diagnosis not present

## 2021-02-24 DIAGNOSIS — Z79899 Other long term (current) drug therapy: Secondary | ICD-10-CM | POA: Insufficient documentation

## 2021-02-24 DIAGNOSIS — K219 Gastro-esophageal reflux disease without esophagitis: Secondary | ICD-10-CM | POA: Insufficient documentation

## 2021-02-24 DIAGNOSIS — Z809 Family history of malignant neoplasm, unspecified: Secondary | ICD-10-CM | POA: Insufficient documentation

## 2021-02-24 DIAGNOSIS — R12 Heartburn: Secondary | ICD-10-CM | POA: Diagnosis not present

## 2021-02-24 DIAGNOSIS — I10 Essential (primary) hypertension: Secondary | ICD-10-CM | POA: Diagnosis not present

## 2021-02-24 DIAGNOSIS — K2970 Gastritis, unspecified, without bleeding: Secondary | ICD-10-CM | POA: Diagnosis not present

## 2021-02-24 DIAGNOSIS — K5730 Diverticulosis of large intestine without perforation or abscess without bleeding: Secondary | ICD-10-CM | POA: Diagnosis not present

## 2021-02-24 DIAGNOSIS — G4730 Sleep apnea, unspecified: Secondary | ICD-10-CM | POA: Diagnosis not present

## 2021-02-24 DIAGNOSIS — D124 Benign neoplasm of descending colon: Secondary | ICD-10-CM | POA: Insufficient documentation

## 2021-02-24 DIAGNOSIS — K648 Other hemorrhoids: Secondary | ICD-10-CM | POA: Insufficient documentation

## 2021-02-24 DIAGNOSIS — K635 Polyp of colon: Secondary | ICD-10-CM | POA: Diagnosis not present

## 2021-02-24 HISTORY — PX: ESOPHAGOGASTRODUODENOSCOPY (EGD) WITH PROPOFOL: SHX5813

## 2021-02-24 HISTORY — PX: COLONOSCOPY WITH PROPOFOL: SHX5780

## 2021-02-24 HISTORY — PX: BIOPSY: SHX5522

## 2021-02-24 HISTORY — PX: POLYPECTOMY: SHX5525

## 2021-02-24 SURGERY — COLONOSCOPY WITH PROPOFOL
Anesthesia: General

## 2021-02-24 MED ORDER — LIDOCAINE VISCOUS HCL 2 % MT SOLN
15.0000 mL | Freq: Once | OROMUCOSAL | Status: AC
  Administered 2021-02-24: 15 mL via OROMUCOSAL

## 2021-02-24 MED ORDER — LACTATED RINGERS IV SOLN: INTRAVENOUS | Status: DC

## 2021-02-24 MED ORDER — LIDOCAINE HCL (CARDIAC) PF 100 MG/5ML IV SOSY
PREFILLED_SYRINGE | INTRAVENOUS | Status: DC | PRN
  Administered 2021-02-24: 50 mg via INTRAVENOUS

## 2021-02-24 MED ORDER — PROPOFOL 500 MG/50ML IV EMUL
INTRAVENOUS | Status: DC | PRN
  Administered 2021-02-24: 150 ug/kg/min via INTRAVENOUS

## 2021-02-24 MED ORDER — STERILE WATER FOR IRRIGATION IR SOLN
Status: DC | PRN
  Administered 2021-02-24: 200 mL

## 2021-02-24 MED ORDER — LIDOCAINE VISCOUS HCL 2 % MT SOLN
OROMUCOSAL | Status: AC
  Filled 2021-02-24: qty 15

## 2021-02-24 MED ORDER — PROPOFOL 10 MG/ML IV BOLUS
INTRAVENOUS | Status: DC | PRN
  Administered 2021-02-24: 100 mg via INTRAVENOUS

## 2021-02-24 NOTE — Anesthesia Procedure Notes (Signed)
Date/Time: 02/24/2021 12:16 PM Performed by: Orlie Dakin, CRNA Pre-anesthesia Checklist: Patient identified, Emergency Drugs available, Suction available and Patient being monitored Patient Re-evaluated:Patient Re-evaluated prior to induction Oxygen Delivery Method: Nasal cannula Induction Type: IV induction Placement Confirmation: positive ETCO2

## 2021-02-24 NOTE — H&P (Signed)
Primary Care Physician:  [facility] Primary Gastroenterologist:  Dr. Abbey Chatters  Pre-Procedure History & Physical: HPI:  Aaron Peck. is a 60 y.o. male is here for AN EGD and colonoscopy due to iron deficiency anemia.   Recent blood work showed patient is iron deficient as well as slight anemia with hemoglobin 12.8.  According to his chart he is also had a heme positive stool in the past.  Last colonoscopy in 2015 largely unremarkable besides one small polyp which was removed, pathology benign.  No previous upper endoscopy.  Does note chronic reflux which is well controlled on omeprazole.  No chest pain or other alarm symptoms.  No dysphagia or odynophagia.  No chronic NSAID use.  No history of PUD or H. pylori that he knows of.  Does have hypertension and his blood pressure is elevated today in clinic.  He states he has not taken his blood pressure medications today.  Denies any chest pain or headaches.  Past Medical History:  Diagnosis Date  . GERD (gastroesophageal reflux disease)   . Hypertension   . Sleep apnea     Past Surgical History:  Procedure Laterality Date  . COLONOSCOPY     can not remeber when but it has been awhile, at least 5 years ago  . COLONOSCOPY [name] 03/12/2014   Procedure: COLONOSCOPY;  Surgeon: Danie Binder, MD;  Location: [facility];  Service: Endoscopy;  Laterality: [name];  10:00    Prior to Admission medications   Medication Sig Start Date End Date Taking? Authorizing Provider  acetaminophen (TYLENOL) 325 MG tablet Take 325 mg by mouth every 6 (six) hours as needed (for pain.).   Yes [provider]  aspirin EC 81 MG tablet Take 81 mg by mouth daily. Swallow whole.   Yes [provider]  carvedilol (COREG) 6.25 MG tablet Take 6.25 mg by mouth in the morning and at bedtime.   Yes [provider]  cholecalciferol (VITAMIN D) 25 MCG (1000 UNIT) tablet Take 1,000 Units by mouth daily. 10/21/20  Yes [provider]  diltiazem (DILACOR XR) 120 MG 24 hr capsule Take 360 mg by mouth daily. Takes one tablet 360 mg daily.   Yes [provider]  losartan-hydrochlorothiazide (HYZAAR) 100-12.5 MG tablet Take 1 tablet by mouth daily. 12/30/19  Yes [provider]  Multiple Vitamin (MULTIVITAMIN WITH MINERALS) TABS tablet Take 1 tablet by mouth daily.   Yes [provider]  NIFEdipine (PROCARDIA XL/NIFEDICAL-XL) 90 MG 24 hr tablet Take 90 mg by mouth daily.   Yes [provider]  omeprazole (PRILOSEC) 20 MG capsule Take 20 mg by mouth daily.   Yes [provider]  lisinopril (PRINIVIL,ZESTRIL) 20 MG tablet Take 20 mg by mouth daily.  02/16/20  [provider]    Allergies as of 11/27/2020  . (No Known Allergies)    Family History  Problem Relation Age of Onset  . Hypertension Mother   . Cancer Father   . Colon cancer Neg Hx     Social History   Socioeconomic History  . Marital status: Married    Spouse name: Not on file  . Number of children: Not on file  . Years of education: Not on file  . Highest education level: Not on file  Occupational History  . Occupation: Technical brewer: [employer]  Tobacco Use  . Smoking status: Never Smoker  . Smokeless tobacco: Never Used  Substance and Sexual Activity  .  Alcohol use: No  . Drug use: No  . Sexual activity: Not on file  Other Topics Concern  . Not on file  Social History Narrative  . Not on file   Social Determinants of Health   Financial Resource Strain: Not on file  Food Insecurity: Not on file  Transportation Needs: Not on file  Physical Activity: Not on file  Stress: Not on file  Social Connections: Not on file  Intimate Partner Violence: Not on file    Review of Systems: See HPI, otherwise negative ROS  Physical Exam: Vital signs in last 24 hours: Temp:  [98.6 F (37 C)] 98.6 F (37 C) (03/15 1101) Pulse Rate:  [85] 85 (03/15 1101) Resp:  [17] 17 (03/15  1101) BP: (137)/(86) 137/86 (03/15 1101) SpO2:  [99 %] 99 % (03/15 1101)   General:   Alert,  Well-developed, well-nourished, pleasant and cooperative in NAD Head:  Normocephalic and atraumatic. Eyes:  Sclera clear, no icterus.   Conjunctiva pink. Ears:  Normal auditory acuity. Nose:  No deformity, discharge,  or lesions. Mouth:  No deformity or lesions, dentition normal. Neck:  Supple; no masses or thyromegaly. Lungs:  Clear throughout to auscultation.   No wheezes, crackles, or rhonchi. No acute distress. Heart:  Regular rate and rhythm; no murmurs, clicks, rubs,  or gallops. Abdomen:  Soft, nontender and nondistended. No masses, hepatosplenomegaly or hernias noted. Normal bowel sounds, without guarding, and without rebound.   Msk:  Symmetrical without gross deformities. Normal posture. Extremities:  Without clubbing or edema. Neurologic:  Alert and  oriented x4;  grossly normal neurologically. Skin:  Intact without significant lesions or rashes. Cervical Nodes:  No significant cervical adenopathy. Psych:  Alert and cooperative. Normal mood and affect.  Impression/Plan: Aaron Peck. is here for AN EGD and colonoscopy due to iron deficiency anemia.  The risks of the procedure including infection, bleed, or perforation as well as benefits, limitations, alternatives and imponderables have been reviewed with the patient. Questions have been answered. All parties agreeable.

## 2021-02-24 NOTE — Discharge Instructions (Signed)
EGD Discharge instructions Please read the instructions outlined below and refer to this sheet in the next few weeks. These discharge instructions provide you with general information on caring for yourself after you leave the hospital. Your doctor may also give you specific instructions. While your treatment has been planned according to the most current medical practices available, unavoidable complications occasionally occur. If you have any problems or questions after discharge, please call your doctor. ACTIVITY  You may resume your regular activity but move at a slower pace for the next 24 hours.   Take frequent rest periods for the next 24 hours.   Walking will help expel (get rid of) the air and reduce the bloated feeling in your abdomen.   No driving for 24 hours (because of the anesthesia (medicine) used during the test).   You may shower.   Do not sign any important legal documents or operate any machinery for 24 hours (because of the anesthesia used during the test).  NUTRITION  Drink plenty of fluids.   You may resume your normal diet.   Begin with a light meal and progress to your normal diet.   Avoid alcoholic beverages for 24 hours or as instructed by your caregiver.  MEDICATIONS  You may resume your normal medications unless your caregiver tells you otherwise.  WHAT YOU CAN EXPECT TODAY  You may experience abdominal discomfort such as a feeling of fullness or "gas" pains.  FOLLOW-UP  Your doctor will discuss the results of your test with you.  SEEK IMMEDIATE MEDICAL ATTENTION IF ANY OF THE FOLLOWING OCCUR:  Excessive nausea (feeling sick to your stomach) and/or vomiting.   Severe abdominal pain and distention (swelling).   Trouble swallowing.   Temperature over 101 F (37.8 C).   Rectal bleeding or vomiting of blood.     Colonoscopy Discharge Instructions  Read the instructions outlined below and refer to this sheet in the next few weeks. These  discharge instructions provide you with general information on caring for yourself after you leave the hospital. Your doctor may also give you specific instructions. While your treatment has been planned according to the most current medical practices available, unavoidable complications occasionally occur.   ACTIVITY  You may resume your regular activity, but move at a slower pace for the next 24 hours.   Take frequent rest periods for the next 24 hours.   Walking will help get rid of the air and reduce the bloated feeling in your belly (abdomen).   No driving for 24 hours (because of the medicine (anesthesia) used during the test).    Do not sign any important legal documents or operate any machinery for 24 hours (because of the anesthesia used during the test).  NUTRITION  Drink plenty of fluids.   You may resume your normal diet as instructed by your doctor.   Begin with a light meal and progress to your normal diet. Heavy or fried foods are harder to digest and may make you feel sick to your stomach (nauseated).   Avoid alcoholic beverages for 24 hours or as instructed.  MEDICATIONS  You may resume your normal medications unless your doctor tells you otherwise.  WHAT YOU CAN EXPECT TODAY  Some feelings of bloating in the abdomen.   Passage of more gas than usual.   Spotting of blood in your stool or on the toilet paper.  IF YOU HAD POLYPS REMOVED DURING THE COLONOSCOPY:  No aspirin products for 7 days or as instructed.  No alcohol for 7 days or as instructed.   Eat a soft diet for the next 24 hours.  FINDING OUT THE RESULTS OF YOUR TEST Not all test results are available during your visit. If your test results are not back during the visit, make an appointment with your caregiver to find out the results. Do not assume everything is normal if you have not heard from your caregiver or the medical facility. It is important for you to follow up on all of your test results.   SEEK IMMEDIATE MEDICAL ATTENTION IF:  You have more than a spotting of blood in your stool.   Your belly is swollen (abdominal distention).   You are nauseated or vomiting.   You have a temperature over 101.   You have abdominal pain or discomfort that is severe or gets worse throughout the day.   Your EGD revealed a mild amount of inflammation in your stomach.  I took biopsies of this to rule out infection with bacteria called H. pylori.  Continue on omeprazole for chronic reflux.  May attempt to wean off of this with changes in your diet but this could be difficult.  Await pathology results, my office will contact you.  Your colonoscopy revealed 2 polyp(s) which I removed successfully. Await pathology results, my office will contact you. I recommend repeating colonoscopy in 5 years for surveillance purposes.   Otherwise follow-up with GI as needed.  I hope you have a great rest of your week!  Elon Alas. Abbey Chatters, D.O. Gastroenterology and Hepatology [facility]

## 2021-02-24 NOTE — Anesthesia Preprocedure Evaluation (Signed)
Anesthesia Evaluation  Patient identified by MRN, date of birth, ID band Patient awake    Reviewed: Allergy & Precautions, NPO status , Patient's Chart, lab work & pertinent test results, reviewed documented beta blocker date and time   History of Anesthesia Complications Negative for: history of anesthetic complications  Airway Mallampati: III  TM Distance: >3 FB Neck ROM: Full    Dental  (+) Dental Advisory Given, Loose,    Pulmonary sleep apnea ,    Pulmonary exam normal breath sounds clear to auscultation       Cardiovascular Exercise Tolerance: Good hypertension, Pt. on medications and Pt. on home beta blockers Normal cardiovascular exam Rhythm:Regular Rate:Normal  20-Feb-2021 15:10:31 [facility] ROUTINE RECORD Normal sinus rhythm Nonspecific T wave abnormality Left ventricular hypertrophy Abnormal ECG Confirmed by Glori Bickers [phone]) on 02/20/2021 9:54:32 PM   Neuro/Psych negative neurological ROS  negative psych ROS   GI/Hepatic Neg liver ROS, GERD  Medicated,  Endo/Other  negative endocrine ROS  Renal/GU negative Renal ROS     Musculoskeletal negative musculoskeletal ROS (+)   Abdominal   Peds  Hematology negative hematology ROS (+)   Anesthesia Other Findings   Reproductive/Obstetrics negative OB ROS                             Anesthesia Physical Anesthesia Plan  ASA: III  Anesthesia Plan: General   Post-op Pain Management:    Induction:   PONV Risk Score and Plan: Propofol infusion  Airway Management Planned: Nasal Cannula and Natural Airway  Additional Equipment:   Intra-op Plan:   Post-operative Plan:   Informed Consent: I have reviewed the patients History and Physical, chart, labs and discussed the procedure including the risks, benefits and alternatives for the proposed anesthesia with the patient or authorized representative  who has indicated his/her understanding and acceptance.     Dental advisory given  Plan Discussed with: CRNA and Surgeon  Anesthesia Plan Comments:         Anesthesia Quick Evaluation

## 2021-02-24 NOTE — Op Note (Signed)
[facility] Patient Name: Aaron Peck Procedure Date: 02/24/2021 12:06 PM MRN: [id number] Date of Birth: [birth year] Attending MD: Elon Alas. Abbey Chatters DO CSN: [id number] Age: 60 Admit Type: Outpatient Procedure:                Colonoscopy Indications:              Iron deficiency anemia Providers:                Elon Alas. Abbey Chatters, DO, Jessica Boudreaux, Randa Spike, Technician Referring MD:              Medicines:                See the Anesthesia note for documentation of the                            administered medications Complications:            No immediate complications. Estimated Blood Loss:     Estimated blood loss was minimal. Procedure:                Pre-Anesthesia Assessment:                           - The anesthesia plan was to use monitored                            anesthesia care (MAC).                           After obtaining informed consent, the colonoscope                            was passed under direct vision. Throughout the                            procedure, the patient's blood pressure, pulse, and                            oxygen saturations were monitored continuously. The                            PCF-H190DL ([id number]) was introduced through the                            anus and advanced to the the cecum, identified by                            appendiceal orifice and ileocecal valve. The                            colonoscopy was performed without difficulty. The                            patient tolerated the procedure well. The quality  of the bowel preparation was evaluated using the                            BBPS [facility] Bowel Preparation Scale) with scores                            of: Right Colon = 2 (minor amount of residual                            staining, small fragments of stool and/or opaque                            liquid, but mucosa seen well), Transverse  Colon = 2                            (minor amount of residual staining, small fragments                            of stool and/or opaque liquid, but mucosa seen                            well) and Left Colon = 2 (minor amount of residual                            staining, small fragments of stool and/or opaque                            liquid, but mucosa seen well). The total BBPS score                            equals 6. The quality of the bowel preparation was                            fair. Scope In: 12:09:21 PM Scope Out: 12:23:17 PM Scope Withdrawal Time: 0 hours 12 minutes 29 seconds  Total Procedure Duration: 0 hours 13 minutes 56 seconds  Findings:      The perianal and digital rectal examinations were normal.      Non-bleeding internal hemorrhoids were found during endoscopy.      A few small-mouthed diverticula were found in the sigmoid colon.      A 2 mm polyp was found in the descending colon. The polyp was sessile.       The polyp was removed with a cold biopsy forceps. Resection and       retrieval were complete.      A 5 mm polyp was found in the descending colon. The polyp was sessile.       The polyp was removed with a cold snare. Resection and retrieval were       complete. Impression:               - Preparation of the colon was fair.                           - Non-bleeding internal hemorrhoids.                           -  Diverticulosis in the sigmoid colon.                           - One 2 mm polyp in the descending colon, removed                            with a cold biopsy forceps. Resected and retrieved.                           - One 5 mm polyp in the descending colon, removed                            with a cold snare. Resected and retrieved. Moderate Sedation:      Per Anesthesia Care Recommendation:           - Patient has a contact number available for                            emergencies. The signs and symptoms of potential                             delayed complications were discussed with the                            patient. Return to normal activities tomorrow.                            Written discharge instructions were provided to the                            patient.                           - Resume previous diet.                           - Continue present medications.                           - Await pathology results.                           - Repeat colonoscopy in 5 years for surveillance.                           - Return to GI clinic PRN. Procedure Code(s):        --- Professional ---                           [phone], Colonoscopy, flexible; with removal of                            tumor(s), polyp(s), or other lesion(s) by snare                            technique  52174, 37, Colonoscopy, flexible; with biopsy,                            single or multiple Diagnosis Code(s):        --- Professional ---                           K64.8, Other hemorrhoids                           K63.5, Polyp of colon                           D50.9, Iron deficiency anemia, unspecified                           K57.30, Diverticulosis of large intestine without                            perforation or abscess without bleeding CPT copyright 2019 American Medical Association. All rights reserved. The codes documented in this report are preliminary and upon coder review may  be revised to meet current compliance requirements. Elon Alas. Abbey Chatters, DO [place] Abbey Chatters, DO 02/24/2021 12:29:20 PM This report has been signed electronically. Number of Addenda: 0

## 2021-02-24 NOTE — Transfer of Care (Signed)
Immediate Anesthesia Transfer of Care Note  Patient: Aaron Peck.  Procedure(s) Performed: COLONOSCOPY WITH PROPOFOL ([name] ) ESOPHAGOGASTRODUODENOSCOPY (EGD) WITH PROPOFOL ([name] ) BIOPSY POLYPECTOMY  Patient Location: Short Stay  Anesthesia Type:General  Level of Consciousness: awake and oriented  Airway & Oxygen Therapy: Patient Spontanous Breathing  Post-op Assessment: Report given to RN and Post -op Vital signs reviewed and stable  Post vital signs: Reviewed and stable  Last Vitals:  Vitals Value Taken Time  BP    Temp    Pulse    Resp    SpO2      Last Pain:  Vitals:   02/24/21 1157  TempSrc:   PainSc: 0-No pain         Complications: No complications documented.

## 2021-02-24 NOTE — Op Note (Addendum)
[facility] Patient Name: Aaron Peck Procedure Date: 02/24/2021 11:46 AM MRN: [id number] Date of Birth: 10-23-1961 Attending MD: Elon Alas. Abbey Chatters DO CSN: [id number] Age: 60 Admit Type: Outpatient Procedure:                Upper GI endoscopy Indications:              Iron deficiency anemia, Heartburn Providers:                Elon Alas. Abbey Chatters, DO, Janeece Riggers, RN, Nelma Rothman,                            Technician Referring MD:              Medicines:                See the Anesthesia note for documentation of the                            administered medications Complications:            No immediate complications. Estimated Blood Loss:     Estimated blood loss was minimal. Procedure:                Pre-Anesthesia Assessment:                           - The anesthesia plan was to use monitored                            anesthesia care (MAC).                           After obtaining informed consent, the endoscope was                            passed under direct vision. Throughout the                            procedure, the patient's blood pressure, pulse, and                            oxygen saturations were monitored continuously. The                            GIF-H190 ([id number]) scope was introduced through the                            mouth, and advanced to the second part of duodenum.                            The upper GI endoscopy was accomplished without                            difficulty. The patient tolerated the procedure                            well. Scope In: 12:01:31  PM Scope Out: 12:03:51 PM Total Procedure Duration: 0 hours 2 minutes 20 seconds  Findings:      There is no endoscopic evidence of bleeding, areas of erosion,       esophagitis, hiatal hernia, ulcerations or varices in the entire       esophagus.      Localized mild inflammation characterized by erythema was found in the       gastric antrum. Biopsies were taken with a cold  forceps for Helicobacter       pylori testing.      The duodenal bulb, first portion of the duodenum and second portion of       the duodenum were normal. Impression:               - Gastritis. Biopsied.                           - Normal duodenal bulb, first portion of the                            duodenum and second portion of the duodenum. Moderate Sedation:      Per Anesthesia Care Recommendation:           - Patient has a contact number available for                            emergencies. The signs and symptoms of potential                            delayed complications were discussed with the                            patient. Return to normal activities tomorrow.                            Written discharge instructions were provided to the                            patient.                           - Resume previous diet.                           - Continue present medications.                           - Await pathology results.                           - Return to GI clinic PRN. Procedure Code(s):        --- Professional ---                           [phone], Esophagogastroduodenoscopy, flexible,                            transoral; with biopsy, single or multiple Diagnosis Code(s):        ---  Professional ---                           K29.70, Gastritis, unspecified, without bleeding                           D50.9, Iron deficiency anemia, unspecified                           R12, Heartburn CPT copyright 2019 American Medical Association. All rights reserved. The codes documented in this report are preliminary and upon coder review may  be revised to meet current compliance requirements. Elon Alas. Abbey Chatters, DO [name], DO 02/24/2021 12:06:33 PM This report has been signed electronically. Number of Addenda: 0

## 2021-02-25 NOTE — Anesthesia Postprocedure Evaluation (Signed)
Anesthesia Post Note  Patient: Aaron Peck.  Procedure(s) Performed: COLONOSCOPY WITH PROPOFOL ([name] ) ESOPHAGOGASTRODUODENOSCOPY (EGD) WITH PROPOFOL ([name] ) BIOPSY POLYPECTOMY  Patient location during evaluation: Phase II Anesthesia Type: General Level of consciousness: awake and alert and oriented Pain management: pain level controlled Vital Signs Assessment: post-procedure vital signs reviewed and stable Respiratory status: spontaneous breathing and respiratory function stable Cardiovascular status: blood pressure returned to baseline Postop Assessment: no apparent nausea or vomiting Anesthetic complications: no   No complications documented.   Last Vitals:  Vitals:   02/24/21 1101 02/24/21 1228  BP: 137/86 104/81  Pulse: 85 82  Resp: 17 20  Temp: 37 C 36.4 C  SpO2: 99% 97%    Last Pain:  Vitals:   02/24/21 1228  TempSrc: Oral  PainSc: 0-No pain                 Bishop Vanderwerf C Tokiko Diefenderfer

## 2021-02-26 LAB — SURGICAL PATHOLOGY

## 2021-03-02 ENCOUNTER — Encounter (HOSPITAL_COMMUNITY): Payer: Self-pay | Admitting: Internal Medicine

## 2024-08-18 ENCOUNTER — Other Ambulatory Visit: Payer: Self-pay

## 2024-08-18 ENCOUNTER — Inpatient Hospital Stay (HOSPITAL_COMMUNITY)

## 2024-08-18 ENCOUNTER — Emergency Department (HOSPITAL_COMMUNITY)

## 2024-08-18 ENCOUNTER — Observation Stay (HOSPITAL_COMMUNITY)
Admission: EM | Admit: 2024-08-18 | Discharge: 2024-08-20 | Disposition: A | Discharge status: Home / Self Care | Attending: General Surgery | Admitting: General Surgery

## 2024-08-18 ENCOUNTER — Encounter (HOSPITAL_COMMUNITY): Payer: Self-pay | Admitting: Emergency Medicine

## 2024-08-18 DIAGNOSIS — E669 Obesity, unspecified: Secondary | ICD-10-CM | POA: Diagnosis present

## 2024-08-18 DIAGNOSIS — K811 Chronic cholecystitis: Principal | ICD-10-CM | POA: Insufficient documentation

## 2024-08-18 DIAGNOSIS — K819 Cholecystitis, unspecified: Principal | ICD-10-CM

## 2024-08-18 DIAGNOSIS — Z7982 Long term (current) use of aspirin: Secondary | ICD-10-CM | POA: Diagnosis not present

## 2024-08-18 DIAGNOSIS — I10 Essential (primary) hypertension: Secondary | ICD-10-CM | POA: Diagnosis present

## 2024-08-18 DIAGNOSIS — Z7902 Long term (current) use of antithrombotics/antiplatelets: Secondary | ICD-10-CM | POA: Diagnosis not present

## 2024-08-18 DIAGNOSIS — Z6830 Body mass index (BMI) 30.0-30.9, adult: Secondary | ICD-10-CM | POA: Insufficient documentation

## 2024-08-18 DIAGNOSIS — K810 Acute cholecystitis: Secondary | ICD-10-CM | POA: Diagnosis not present

## 2024-08-18 DIAGNOSIS — R1011 Right upper quadrant pain: Secondary | ICD-10-CM | POA: Diagnosis present

## 2024-08-18 LAB — URINALYSIS, ROUTINE W REFLEX MICROSCOPIC
Bacteria, UA: NONE SEEN
Bilirubin Urine: NEGATIVE
Glucose, UA: NEGATIVE mg/dL
Hgb urine dipstick: NEGATIVE
Ketones, ur: NEGATIVE mg/dL
Leukocytes,Ua: NEGATIVE
Nitrite: NEGATIVE
Protein, ur: 30 mg/dL — AB
Specific Gravity, Urine: >1.046 — ABNORMAL HIGH (ref 1.005–1.030)
pH: 5 (ref 5.0–8.0)

## 2024-08-18 LAB — COMPREHENSIVE METABOLIC PANEL WITH GFR
ALT: 15 U/L (ref 0–44)
AST: 17 U/L (ref 15–41)
Albumin: 3.8 g/dL (ref 3.5–5.0)
Alkaline Phosphatase: 67 U/L (ref 38–126)
Anion gap: 11 (ref 5–15)
BUN: 17 mg/dL (ref 8–23)
CO2: 25 mmol/L (ref 22–32)
Calcium: 9.6 mg/dL (ref 8.9–10.3)
Chloride: 105 mmol/L (ref 98–111)
Creatinine, Ser: 1.54 mg/dL — ABNORMAL HIGH (ref 0.61–1.24)
GFR, Estimated: 50 mL/min — ABNORMAL LOW (ref >60)
Glucose, Bld: 141 mg/dL — ABNORMAL HIGH (ref 70–99)
Potassium: 3.7 mmol/L (ref 3.5–5.1)
Sodium: 141 mmol/L (ref 135–145)
Total Bilirubin: 0.5 mg/dL (ref 0.0–1.2)
Total Protein: 7.5 g/dL (ref 6.5–8.1)

## 2024-08-18 LAB — MRSA NEXT GEN BY PCR, NASAL: MRSA by PCR Next Gen: NOT DETECTED

## 2024-08-18 LAB — CBC
HCT: 46.6 % (ref 39.0–52.0)
Hemoglobin: 15.5 g/dL (ref 13.0–17.0)
MCH: 31 pg (ref 26.0–34.0)
MCHC: 33.3 g/dL (ref 30.0–36.0)
MCV: 93.2 fL (ref 80.0–100.0)
Platelets: 233 K/uL (ref 150–400)
RBC: 5 MIL/uL (ref 4.22–5.81)
RDW: 13.4 % (ref 11.5–15.5)
WBC: 7.6 K/uL (ref 4.0–10.5)
nRBC: 0 % (ref 0.0–0.2)

## 2024-08-18 LAB — HIV ANTIBODY (ROUTINE TESTING W REFLEX): HIV Screen 4th Generation wRfx: NONREACTIVE

## 2024-08-18 LAB — LIPASE, BLOOD: Lipase: 39 U/L (ref 11–51)

## 2024-08-18 MED ORDER — BISACODYL 10 MG RE SUPP
10.0000 mg | Freq: Every day | RECTAL | Status: DC | PRN
Start: 2024-08-18 — End: 2024-08-20

## 2024-08-18 MED ORDER — VITAMIN D 25 MCG (1000 UNIT) PO TABS
1000.0000 [IU] | ORAL_TABLET | Freq: Every day | ORAL | Status: DC
  Administered 2024-08-18 – 2024-08-19 (×2): 1000 [IU] via ORAL
  Filled 2024-08-18 (×2): qty 1

## 2024-08-18 MED ORDER — CARVEDILOL 3.125 MG PO TABS
6.2500 mg | ORAL_TABLET | Freq: Two times a day (BID) | ORAL | Status: DC
  Administered 2024-08-18 – 2024-08-20 (×6): 6.25 mg via ORAL
  Filled 2024-08-18 (×6): qty 2

## 2024-08-18 MED ORDER — PANTOPRAZOLE SODIUM 40 MG PO TBEC
40.0000 mg | DELAYED_RELEASE_TABLET | Freq: Every day | ORAL | Status: DC
  Administered 2024-08-18 – 2024-08-19 (×2): 40 mg via ORAL
  Filled 2024-08-18 (×2): qty 1

## 2024-08-18 MED ORDER — NIFEDIPINE ER OSMOTIC RELEASE 30 MG PO TB24
90.0000 mg | ORAL_TABLET | Freq: Every day | ORAL | Status: DC
  Administered 2024-08-18 – 2024-08-20 (×3): 90 mg via ORAL
  Filled 2024-08-18 (×3): qty 3

## 2024-08-18 MED ORDER — SODIUM CHLORIDE 0.9 % IV SOLN
2.0000 g | Freq: Once | INTRAVENOUS | Status: AC
  Administered 2024-08-18: 2 g via INTRAVENOUS
  Filled 2024-08-18: qty 20

## 2024-08-18 MED ORDER — SODIUM CHLORIDE 0.9 % IV SOLN: INTRAVENOUS | Status: DC

## 2024-08-18 MED ORDER — SODIUM CHLORIDE 0.9% FLUSH
3.0000 mL | Freq: Two times a day (BID) | INTRAVENOUS | Status: DC
  Administered 2024-08-19 (×3): 3 mL via INTRAVENOUS

## 2024-08-18 MED ORDER — HEPARIN SODIUM (PORCINE) 5000 UNIT/ML IJ SOLN
5000.0000 [IU] | Freq: Three times a day (TID) | INTRAMUSCULAR | Status: DC
  Administered 2024-08-18 – 2024-08-20 (×5): 5000 [IU] via SUBCUTANEOUS
  Filled 2024-08-18 (×5): qty 1

## 2024-08-18 MED ORDER — ADULT MULTIVITAMIN W/MINERALS CH
1.0000 | ORAL_TABLET | Freq: Every day | ORAL | Status: DC
  Administered 2024-08-18 – 2024-08-19 (×2): 1 via ORAL
  Filled 2024-08-18 (×2): qty 1

## 2024-08-18 MED ORDER — MORPHINE SULFATE (PF) 4 MG/ML IV SOLN
4.0000 mg | Freq: Once | INTRAVENOUS | Status: AC
  Administered 2024-08-18: 4 mg via INTRAVENOUS
  Filled 2024-08-18: qty 1

## 2024-08-18 MED ORDER — ACETAMINOPHEN 650 MG RE SUPP: 650.0000 mg | Freq: Four times a day (QID) | RECTAL | Status: DC | PRN

## 2024-08-18 MED ORDER — METRONIDAZOLE 500 MG/100ML IV SOLN
500.0000 mg | Freq: Two times a day (BID) | INTRAVENOUS | Status: DC
  Administered 2024-08-18 – 2024-08-20 (×4): 500 mg via INTRAVENOUS
  Filled 2024-08-18 (×4): qty 100

## 2024-08-18 MED ORDER — SODIUM CHLORIDE 0.9 % IV SOLN: INTRAVENOUS | Status: AC | PRN

## 2024-08-18 MED ORDER — IOHEXOL 300 MG/ML  SOLN
100.0000 mL | Freq: Once | INTRAMUSCULAR | Status: AC | PRN
  Administered 2024-08-18: 100 mL via INTRAVENOUS

## 2024-08-18 MED ORDER — ONDANSETRON HCL 4 MG PO TABS: 4.0000 mg | ORAL_TABLET | Freq: Four times a day (QID) | ORAL | Status: DC | PRN

## 2024-08-18 MED ORDER — METRONIDAZOLE 500 MG/100ML IV SOLN
500.0000 mg | Freq: Once | INTRAVENOUS | Status: AC
  Administered 2024-08-18: 500 mg via INTRAVENOUS
  Filled 2024-08-18: qty 100

## 2024-08-18 MED ORDER — ACETAMINOPHEN 325 MG PO TABS
650.0000 mg | ORAL_TABLET | Freq: Four times a day (QID) | ORAL | Status: DC | PRN
  Administered 2024-08-18: 650 mg via ORAL
  Filled 2024-08-18: qty 2

## 2024-08-18 MED ORDER — NIFEDIPINE ER OSMOTIC RELEASE 30 MG PO TB24: 90.0000 mg | ORAL_TABLET | Freq: Every day | ORAL | Status: DC

## 2024-08-18 MED ORDER — SODIUM CHLORIDE 0.9% FLUSH: 3.0000 mL | INTRAVENOUS | Status: DC | PRN

## 2024-08-18 MED ORDER — HYDRALAZINE HCL 20 MG/ML IJ SOLN
10.0000 mg | Freq: Four times a day (QID) | INTRAMUSCULAR | Status: DC | PRN
  Administered 2024-08-18: 10 mg via INTRAVENOUS
  Filled 2024-08-18: qty 1

## 2024-08-18 MED ORDER — ONDANSETRON HCL 4 MG/2ML IJ SOLN
4.0000 mg | Freq: Once | INTRAMUSCULAR | Status: AC
Start: 2024-08-18 — End: 2024-08-18
  Administered 2024-08-18: 4 mg via INTRAVENOUS
  Filled 2024-08-18: qty 2

## 2024-08-18 MED ORDER — SODIUM CHLORIDE 0.9% FLUSH
3.0000 mL | Freq: Two times a day (BID) | INTRAVENOUS | Status: DC
  Administered 2024-08-19 (×2): 3 mL via INTRAVENOUS

## 2024-08-18 MED ORDER — SODIUM CHLORIDE 0.9 % IV BOLUS
1000.0000 mL | Freq: Once | INTRAVENOUS | Status: AC
  Administered 2024-08-18: 1000 mL via INTRAVENOUS

## 2024-08-18 MED ORDER — OXYCODONE HCL 5 MG PO TABS
5.0000 mg | ORAL_TABLET | ORAL | Status: DC | PRN
  Administered 2024-08-19 – 2024-08-20 (×2): 5 mg via ORAL
  Filled 2024-08-18 (×2): qty 1

## 2024-08-18 MED ORDER — POLYETHYLENE GLYCOL 3350 17 G PO PACK: 17.0000 g | PACK | Freq: Every day | ORAL | Status: DC | PRN

## 2024-08-18 MED ORDER — SODIUM CHLORIDE 0.9 % IV SOLN
2.0000 g | INTRAVENOUS | Status: DC
  Administered 2024-08-19 – 2024-08-20 (×2): 2 g via INTRAVENOUS
  Filled 2024-08-18 (×2): qty 20

## 2024-08-18 MED ORDER — ONDANSETRON HCL 4 MG/2ML IJ SOLN: 4.0000 mg | Freq: Four times a day (QID) | INTRAMUSCULAR | Status: DC | PRN

## 2024-08-18 MED ORDER — HYDROMORPHONE HCL 1 MG/ML IJ SOLN: 1.0000 mg | INTRAMUSCULAR | Status: DC | PRN

## 2024-08-18 NOTE — H&P (View-Only) (Signed)
 [facility] Consult  Reason for Consult: Acute cholecystitis  Referring Physician:  Dr. Marlyse Dr. Pearlean   Chief Complaint   Abdominal Pain     HPI: Aaron Peck. is a 63 y.o. male with RUQ pain and epigastric pain that started having pain the a few hours before he came in to the ED. He denied any nausea/vomiting. He was evaluated in the Ed and noted to have concern for cholecystitis.   Past Medical History:  Diagnosis Date   GERD (gastroesophageal reflux disease)    Hypertension    Sleep apnea     Past Surgical History:  Procedure Laterality Date   BIOPSY  02/24/2021   Procedure: BIOPSY;  Surgeon: Cindie Carlin POUR, DO;  [facility]: [facility];  Service: Endoscopy;;   COLONOSCOPY     can not remeber when but it has been awhile, at least 5 years ago   COLONOSCOPY [name] 03/12/2014   Procedure: COLONOSCOPY;  Surgeon: Margo LITTIE Haddock, MD;  Location: [facility];  Service: Endoscopy;  Laterality: [name];  10:00   COLONOSCOPY WITH PROPOFOL  [name] 02/24/2021   Procedure: COLONOSCOPY WITH PROPOFOL ;  Surgeon: Cindie Carlin POUR, DO;  [facility];  Service: Endoscopy;  Laterality: [name];  pm   ESOPHAGOGASTRODUODENOSCOPY (EGD) WITH PROPOFOL  [name] 02/24/2021   Procedure: ESOPHAGOGASTRODUODENOSCOPY (EGD) WITH PROPOFOL ;  Surgeon: Cindie Carlin POUR, DO;  [facility];  Service: Endoscopy;  Laterality: [name];   POLYPECTOMY  02/24/2021   Procedure: POLYPECTOMY;  Surgeon: Cindie Carlin POUR, DO;  [facility];  Service: Endoscopy;;    Family History  Problem Relation Age of Onset   Hypertension Mother    Cancer Father    Colon cancer Neg Hx     Social History   Tobacco Use   Smoking status: Never   Smokeless tobacco: Never  Substance Use Topics   Alcohol use: No   Drug use: No    Medications: I have reviewed the patient's current medications. Prior to Admission:  Medications Prior to Admission  Medication Sig Dispense Refill Last  Dose/Taking   acetaminophen  (TYLENOL ) 500 MG tablet Take 1,000 mg by mouth every 6 (six) hours as needed for mild pain (pain score 1-3).   08/17/2024 Morning   aspirin  EC 81 MG tablet Take 81 mg by mouth daily. Swallow whole.   08/17/2024 at  8:30 AM   carvedilol  (COREG ) 6.25 MG tablet Take 6.25 mg by mouth in the morning and at bedtime.   08/17/2024 Morning   cholecalciferol  (VITAMIN D ) 25 MCG (1000 UNIT) tablet Take 1,000 Units by mouth daily.   08/17/2024 Morning   Flaxseed, Linseed, (FLAX SEED OIL PO) Take 1 capsule by mouth daily.   08/17/2024 Morning   losartan  (COZAAR ) 100 MG tablet Take 100 mg by mouth daily.   08/17/2024 Morning   Multiple Vitamin (MULTIVITAMIN WITH MINERALS) TABS tablet Take 1 tablet by mouth daily.   08/17/2024 Morning   omeprazole  (PRILOSEC) 20 MG capsule Take 20 mg by mouth daily.   08/17/2024 Morning   Scheduled:  carvedilol   6.25 mg Oral BID WC   cholecalciferol   1,000 Units Oral Daily   heparin   5,000 Units Subcutaneous Q8H   multivitamin with minerals  1 tablet Oral Daily   NIFEdipine   90 mg Oral Daily   pantoprazole   40 mg Oral Daily   sodium chloride  flush  3 mL Intravenous Q12H   sodium chloride  flush  3 mL Intravenous Q12H   Continuous:  sodium  chloride     sodium chloride  150 mL/hr at 08/18/24 0926   [START ON 08/19/2024] cefTRIAXone  (ROCEPHIN )  IV     metroNIDAZOLE      PRN:sodium chloride , acetaminophen  **OR** acetaminophen , bisacodyl , hydrALAZINE , HYDROmorphone  (DILAUDID ) injection, ondansetron  **OR** ondansetron  (ZOFRAN ) IV, oxyCODONE , polyethylene glycol, sodium chloride  flush  No Known Allergies   ROS:  A comprehensive review of systems was negative except for: Gastrointestinal: positive for abdominal pain  Blood pressure (!) 162/94, pulse 78, temperature 98.2 F (36.8 C), temperature source Oral, resp. rate 15, height 5' 4 (1.626 m), weight 90.3 kg, SpO2 98%. Physical Exam Vitals reviewed.  HENT:     Head: Normocephalic.  Eyes:     Extraocular  Movements: Extraocular movements intact.  Cardiovascular:     Rate and Rhythm: Normal rate.  Pulmonary:     Effort: Pulmonary effort is normal.  Abdominal:     General: There is distension.     Palpations: Abdomen is soft.     Tenderness: There is abdominal tenderness in the right upper quadrant and epigastric area.  Musculoskeletal:     Comments: Moves all extremities   Neurological:     General: No focal deficit present.     Mental Status: He is alert and oriented to person, place, and time.  Psychiatric:        Mood and Affect: Mood normal.     Results: Results for orders placed or performed during the hospital encounter of 08/18/24 (from the past 48 hours)  Lipase, blood     Status: None   Collection Time: 08/18/24  5:00 AM  Result Value Ref Range   Lipase 39 11 - 51 U/L    Comment: Performed at [facility], [address] [place], [place] [address]  Comprehensive metabolic panel     Status: Abnormal   Collection Time: 08/18/24  5:00 AM  Result Value Ref Range   Sodium 141 135 - 145 mmol/L   Potassium 3.7 3.5 - 5.1 mmol/L   Chloride 105 98 - 111 mmol/L   CO2 25 22 - 32 mmol/L   Glucose, Bld 141 (H) 70 - 99 mg/dL    Comment: Glucose reference range applies only to samples taken after fasting for at least 8 hours.   BUN 17 8 - 23 mg/dL   Creatinine, Ser 8.45 (H) 0.61 - 1.24 mg/dL   Calcium 9.6 8.9 - 89.6 mg/dL   Total Protein 7.5 6.5 - 8.1 g/dL   Albumin  3.8 3.5 - 5.0 g/dL   AST 17 15 - 41 U/L   ALT 15 0 - 44 U/L   Alkaline Phosphatase 67 38 - 126 U/L   Total Bilirubin 0.5 0.0 - 1.2 mg/dL   GFR, Estimated 50 (L) >60 mL/min    Comment: (NOTE) Calculated using the CKD-EPI Creatinine Equation (2021)    Anion gap 11 5 - 15    Comment: Performed at [facility], [address], [place], [place] [address]  CBC     Status: None   Collection Time: 08/18/24  5:00 AM  Result Value Ref Range   WBC 7.6 4.0 - 10.5 K/uL   RBC 5.00 4.22 - 5.81 MIL/uL   Hemoglobin 15.5 13.0 -  17.0 g/dL   HCT 53.3 60.9 - 47.9 %   MCV 93.2 80.0 - 100.0 fL   MCH 31.0 26.0 - 34.0 pg   MCHC 33.3 30.0 - 36.0 g/dL   RDW 86.5 88.4 - 84.4 %   Platelets 233 150 - 400 K/uL  nRBC 0.0 0.0 - 0.2 %    Comment: Performed at [facility], [address] [place], [place] [address]  HIV Antibody (routine testing w rflx)     Status: None   Collection Time: 08/18/24  8:58 AM  Result Value Ref Range   HIV Screen 4th Generation wRfx Non Reactive Non Reactive    Comment: Performed at [facility], [address], [place], [place] [address]  Urinalysis, Routine w reflex microscopic -Urine, Clean Catch     Status: Abnormal   Collection Time: 08/18/24  9:17 AM  Result Value Ref Range   Color, Urine STRAW (A) YELLOW   APPearance CLEAR CLEAR   Specific Gravity, Urine >1.046 (H) 1.005 - 1.030   pH 5.0 5.0 - 8.0   Glucose, UA NEGATIVE NEGATIVE mg/dL   Hgb urine dipstick NEGATIVE NEGATIVE   Bilirubin Urine NEGATIVE NEGATIVE   Ketones, ur NEGATIVE NEGATIVE mg/dL   Protein, ur 30 (A) NEGATIVE mg/dL   Nitrite NEGATIVE NEGATIVE   Leukocytes,Ua NEGATIVE NEGATIVE   RBC / HPF 0-5 0 - 5 RBC/hpf   WBC, UA 0-5 0 - 5 WBC/hpf   Bacteria, UA NONE SEEN NONE SEEN   Squamous Epithelial / HPF 0-5 0 - 5 /HPF   Mucus PRESENT     Comment: Performed at [facility], [address] [place], [place] [address]  MRSA Next Gen by PCR, Nasal     Status: None   Collection Time: 08/18/24 12:14 PM   Specimen: Nasal Mucosa; Nasal Swab  Result Value Ref Range   MRSA by PCR Next Gen NOT DETECTED NOT DETECTED    Comment: (NOTE) The GeneXpert MRSA Assay (FDA approved for NASAL specimens only), is one component of a comprehensive MRSA colonization surveillance program. It is not intended to diagnose MRSA infection nor to guide or monitor treatment for MRSA infections. Test performance is not FDA approved in patients less than 69 years old. Performed at [facility], [address], [place], [place] [address]     Personally reviewed and showed the patient  US  Abdomen Limited RUQ (LIVER/GB) Result Date: 08/18/2024 CLINICAL DATA:  Right upper quadrant pain. EXAM: ULTRASOUND ABDOMEN LIMITED RIGHT UPPER QUADRANT COMPARISON:  CT abdomen pelvis 08/18/2024 FINDINGS: Gallbladder: Mild gallbladder wall distension. Bladder wall is mildly thickened measuring 0.5 cm. No definite stones. Small amount of pericholecystic fluid. Reportedly, the patient does not have a sonographic Murphy sign. Common bile duct: Diameter: 0.4 cm. Liver: Liver is mildly heterogeneous. No discrete lesion. Echogenicity is within normal limits. Portal vein is patent on color Doppler imaging with normal direction of blood flow towards the liver. Other: None. IMPRESSION: 1. Gallbladder is mildly distended with wall thickening and small amount of pericholecystic fluid. Although the patient does not have a sonographic Murphy sign, findings are concerning for acute acalculous cholecystitis. Recommend clinical correlation. Electronically Signed   By: Juliene Balder M.D.   On: 08/18/2024 09:05   CT ABDOMEN PELVIS W CONTRAST Result Date: 08/18/2024 CLINICAL DATA:  Abdominal pain. EXAM: CT ABDOMEN AND PELVIS WITH CONTRAST TECHNIQUE: Multidetector CT imaging of the abdomen and pelvis was performed using the standard protocol following bolus administration of intravenous contrast. RADIATION DOSE REDUCTION: This exam was performed according to the departmental dose-optimization program which includes automated exposure control, adjustment of the mA and/or kV according to patient size and/or use of iterative reconstruction technique. CONTRAST:  OMNIPAQUE  IOHEXOL  300 MG/ML  SOLN COMPARISON:  None Available. FINDINGS: Lower chest:  No acute findings. Hepatobiliary:  No suspicious focal abnormality within the liver parenchyma. Mild intrahepatic biliary duct prominence. Gallbladder is distended with gallbladder wall thickening/pericholecystic fluid. High  density/hyperenhancement is identified in the region of the cystic duct (axial 29/2 and coronal 59/4). Common bile duct in the head of the pancreas is nondilated. Pancreas: No focal mass lesion. No dilatation of the main duct. No intraparenchymal cyst. No peripancreatic edema. Spleen: No splenomegaly. No suspicious focal mass lesion. Adrenals/Urinary Tract: No adrenal nodule or mass. Punctate nonobstructing stone identified lower pole right kidney. Tiny well-defined homogeneous low-density lesions in the right kidney are too small to characterize but statistically most likely benign and probably cysts. No followup imaging is recommended. Left kidney unremarkable. No evidence for hydroureter. The urinary bladder appears normal for the degree of distention. Stomach/Bowel: Stomach is unremarkable. No gastric wall thickening. No evidence of outlet obstruction. Duodenum is normally positioned as is the ligament of Treitz. No small bowel wall thickening. No small bowel dilatation. The terminal ileum is normal. The appendix is normal. No gross colonic mass. No colonic wall thickening. Diverticular changes are noted in the left colon without evidence of diverticulitis. Vascular/Lymphatic: No abdominal aortic aneurysm. No abdominal aortic atherosclerotic calcification. There is no gastrohepatic or hepatoduodenal ligament lymphadenopathy. No retroperitoneal or mesenteric lymphadenopathy. No pelvic sidewall lymphadenopathy. Reproductive: The prostate gland and seminal vesicles are unremarkable. Other: Trace free fluid is seen in the pelvis. Musculoskeletal: No worrisome lytic or sclerotic osseous abnormality. Avascular necrosis noted in the femoral heads bilaterally. IMPRESSION: 1. Gallbladder is distended with gallbladder wall thickening/pericholecystic fluid. High density/hyperenhancement is identified in the region of the cystic duct. Imaging features are concerning for acute cholecystitis, potentially with stone disease in  the cystic duct versus cystic duct hyperenhancement. Right upper quadrant ultrasound recommended to further evaluate. 2. Mild intrahepatic biliary duct prominence. Common bile duct in the head of the pancreas is nondilated. 3. Punctate nonobstructing stone lower pole right kidney. 4. Left colonic diverticulosis without diverticulitis. 5. 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 Electronically Signed   By: Camellia Candle M.D.   On: 08/18/2024 06:00     Assessment & Plan:  Aaron Peck. is a 63 y.o. male with concern for cholecystitis. No obvious stones but could have some sludge. His symptoms are consistent with biliary issues.   PLAN: I counseled the patient about the indication, risks and benefits of robotic assisted laparoscopic cholecystectomy.  He understands there is a very small chance for bleeding, infection, injury to normal structures (including common bile duct), conversion to open surgery, persistent symptoms, evolution of postcholecystectomy diarrhea, need for secondary interventions, anesthesia reaction, cardiopulmonary issues and other risks not specifically detailed here. I described the expected recovery, the plan for follow-up and the restrictions during the recovery phase.  All questions were answered.  OR Monday Antibiotics now Soft diet   All questions were answered to the satisfaction of the patient and family.   Manuelita JAYSON Pander 08/18/2024, 2:00 PM

## 2024-08-18 NOTE — Plan of Care (Signed)
  Problem: Education: Goal: Knowledge of General Education information will improve Description: Including pain rating scale, medication(s)/side effects and non-pharmacologic comfort measures Outcome: Progressing   Problem: Clinical Measurements: Goal: Diagnostic test results will improve Outcome: Progressing   Problem: Coping: Goal: Level of anxiety will decrease Outcome: Progressing   Problem: Pain Managment: Goal: General experience of comfort will improve and/or be controlled Outcome: Progressing

## 2024-08-18 NOTE — ED Notes (Signed)
 Ultrasound at bedside

## 2024-08-18 NOTE — Consult Note (Signed)
 [facility] Consult  Reason for Consult: Acute cholecystitis  Referring Physician:  Dr. Marlyse Dr. Pearlean   Chief Complaint   Abdominal Pain     HPI: Aaron Peck. is a 63 y.o. male with RUQ pain and epigastric pain that started having pain the a few hours before he came in to the ED. He denied any nausea/vomiting. He was evaluated in the Ed and noted to have concern for cholecystitis.   Past Medical History:  Diagnosis Date   GERD (gastroesophageal reflux disease)    Hypertension    Sleep apnea     Past Surgical History:  Procedure Laterality Date   BIOPSY  02/24/2021   Procedure: BIOPSY;  Surgeon: Cindie Carlin POUR, DO;  [facility];  Service: Endoscopy;;   COLONOSCOPY     can not remeber when but it has been awhile, at least 5 years ago   COLONOSCOPY [name] 03/12/2014   Procedure: COLONOSCOPY;  Surgeon: Margo LITTIE Haddock, MD;  [facility];  Service: Endoscopy;  Laterality: [name];  10:00   COLONOSCOPY WITH PROPOFOL  [name] 02/24/2021   Procedure: COLONOSCOPY WITH PROPOFOL ;  Surgeon: Cindie Carlin POUR, DO;  [facility];  Service: Endoscopy;  Laterality: [name];  pm   ESOPHAGOGASTRODUODENOSCOPY (EGD) WITH PROPOFOL  [name] 02/24/2021   Procedure: ESOPHAGOGASTRODUODENOSCOPY (EGD) WITH PROPOFOL ;  Surgeon: Cindie Carlin POUR, DO;  [facility];  Service: Endoscopy;  Laterality: [name];   POLYPECTOMY  02/24/2021   Procedure: POLYPECTOMY;  Surgeon: Cindie Carlin POUR, DO;  [facility];  Service: Endoscopy;;    Family History  Problem Relation Age of Onset   Hypertension Mother    Cancer Father    Colon cancer Neg Hx     Social History   Tobacco Use   Smoking status: Never   Smokeless tobacco: Never  Substance Use Topics   Alcohol use: No   Drug use: No    Medications: I have reviewed the patient's current medications. Prior to Admission:  Medications Prior to Admission  Medication Sig Dispense Refill Last  Dose/Taking   acetaminophen  (TYLENOL ) 500 MG tablet Take 1,000 mg by mouth every 6 (six) hours as needed for mild pain (pain score 1-3).   08/17/2024 Morning   aspirin  EC 81 MG tablet Take 81 mg by mouth daily. Swallow whole.   08/17/2024 at  8:30 AM   carvedilol  (COREG ) 6.25 MG tablet Take 6.25 mg by mouth in the morning and at bedtime.   08/17/2024 Morning   cholecalciferol  (VITAMIN D ) 25 MCG (1000 UNIT) tablet Take 1,000 Units by mouth daily.   08/17/2024 Morning   Flaxseed, Linseed, (FLAX SEED OIL PO) Take 1 capsule by mouth daily.   08/17/2024 Morning   losartan  (COZAAR ) 100 MG tablet Take 100 mg by mouth daily.   08/17/2024 Morning   Multiple Vitamin (MULTIVITAMIN WITH MINERALS) TABS tablet Take 1 tablet by mouth daily.   08/17/2024 Morning   omeprazole  (PRILOSEC) 20 MG capsule Take 20 mg by mouth daily.   08/17/2024 Morning   Scheduled:  carvedilol   6.25 mg Oral BID WC   cholecalciferol   1,000 Units Oral Daily   heparin   5,000 Units Subcutaneous Q8H   multivitamin with minerals  1 tablet Oral Daily   NIFEdipine   90 mg Oral Daily   pantoprazole   40 mg Oral Daily   sodium chloride  flush  3 mL Intravenous Q12H   sodium chloride  flush  3 mL Intravenous Q12H   Continuous:  sodium  chloride     sodium chloride  150 mL/hr at 08/18/24 0926   [START ON 08/19/2024] cefTRIAXone  (ROCEPHIN )  IV     metroNIDAZOLE      PRN:sodium chloride , acetaminophen  **OR** acetaminophen , bisacodyl , hydrALAZINE , HYDROmorphone  (DILAUDID ) injection, ondansetron  **OR** ondansetron  (ZOFRAN ) IV, oxyCODONE , polyethylene glycol, sodium chloride  flush  No Known Allergies   ROS:  A comprehensive review of systems was negative except for: Gastrointestinal: positive for abdominal pain  Blood pressure (!) 162/94, pulse 78, temperature 98.2 F (36.8 C), temperature source Oral, resp. rate 15, height 5' 4 (1.626 m), weight 90.3 kg, SpO2 98%. Physical Exam Vitals reviewed.  HENT:     Head: Normocephalic.  Eyes:     Extraocular  Movements: Extraocular movements intact.  Cardiovascular:     Rate and Rhythm: Normal rate.  Pulmonary:     Effort: Pulmonary effort is normal.  Abdominal:     General: There is distension.     Palpations: Abdomen is soft.     Tenderness: There is abdominal tenderness in the right upper quadrant and epigastric area.  Musculoskeletal:     Comments: Moves all extremities   Neurological:     General: No focal deficit present.     Mental Status: He is alert and oriented to person, place, and time.  Psychiatric:        Mood and Affect: Mood normal.     Results: Results for orders placed or performed during the hospital encounter of 08/18/24 (from the past 48 hours)  Lipase, blood     Status: None   Collection Time: 08/18/24  5:00 AM  Result Value Ref Range   Lipase 39 11 - 51 U/L    Comment: Performed at [facility], [address] [place], [place] [address]  Comprehensive metabolic panel     Status: Abnormal   Collection Time: 08/18/24  5:00 AM  Result Value Ref Range   Sodium 141 135 - 145 mmol/L   Potassium 3.7 3.5 - 5.1 mmol/L   Chloride 105 98 - 111 mmol/L   CO2 25 22 - 32 mmol/L   Glucose, Bld 141 (H) 70 - 99 mg/dL    Comment: Glucose reference range applies only to samples taken after fasting for at least 8 hours.   BUN 17 8 - 23 mg/dL   Creatinine, Ser 8.45 (H) 0.61 - 1.24 mg/dL   Calcium 9.6 8.9 - 89.6 mg/dL   Total Protein 7.5 6.5 - 8.1 g/dL   Albumin  3.8 3.5 - 5.0 g/dL   AST 17 15 - 41 U/L   ALT 15 0 - 44 U/L   Alkaline Phosphatase 67 38 - 126 U/L   Total Bilirubin 0.5 0.0 - 1.2 mg/dL   GFR, Estimated 50 (L) >60 mL/min    Comment: (NOTE) Calculated using the CKD-EPI Creatinine Equation (2021)    Anion gap 11 5 - 15    Comment: Performed at [facility], [address], [place], [place] [address]  CBC     Status: None   Collection Time: 08/18/24  5:00 AM  Result Value Ref Range   WBC 7.6 4.0 - 10.5 K/uL   RBC 5.00 4.22 - 5.81 MIL/uL   Hemoglobin 15.5 13.0 -  17.0 g/dL   HCT 53.3 60.9 - 47.9 %   MCV 93.2 80.0 - 100.0 fL   MCH 31.0 26.0 - 34.0 pg   MCHC 33.3 30.0 - 36.0 g/dL   RDW 86.5 88.4 - 84.4 %   Platelets 233 150 - 400 K/uL  nRBC 0.0 0.0 - 0.2 %    Comment: Performed at [facility], [address] [place], [place] [address]  HIV Antibody (routine testing w rflx)     Status: None   Collection Time: 08/18/24  8:58 AM  Result Value Ref Range   HIV Screen 4th Generation wRfx Non Reactive Non Reactive    Comment: Performed at [facility], [address], [place], [place] [address]  Urinalysis, Routine w reflex microscopic -Urine, Clean Catch     Status: Abnormal   Collection Time: 08/18/24  9:17 AM  Result Value Ref Range   Color, Urine STRAW (A) YELLOW   APPearance CLEAR CLEAR   Specific Gravity, Urine >1.046 (H) 1.005 - 1.030   pH 5.0 5.0 - 8.0   Glucose, UA NEGATIVE NEGATIVE mg/dL   Hgb urine dipstick NEGATIVE NEGATIVE   Bilirubin Urine NEGATIVE NEGATIVE   Ketones, ur NEGATIVE NEGATIVE mg/dL   Protein, ur 30 (A) NEGATIVE mg/dL   Nitrite NEGATIVE NEGATIVE   Leukocytes,Ua NEGATIVE NEGATIVE   RBC / HPF 0-5 0 - 5 RBC/hpf   WBC, UA 0-5 0 - 5 WBC/hpf   Bacteria, UA NONE SEEN NONE SEEN   Squamous Epithelial / HPF 0-5 0 - 5 /HPF   Mucus PRESENT     Comment: Performed at [facility], [address] [place], [place] [address]  MRSA Next Gen by PCR, Nasal     Status: None   Collection Time: 08/18/24 12:14 PM   Specimen: Nasal Mucosa; Nasal Swab  Result Value Ref Range   MRSA by PCR Next Gen NOT DETECTED NOT DETECTED    Comment: (NOTE) The GeneXpert MRSA Assay (FDA approved for NASAL specimens only), is one component of a comprehensive MRSA colonization surveillance program. It is not intended to diagnose MRSA infection nor to guide or monitor treatment for MRSA infections. Test performance is not FDA approved in patients less than 69 years old. Performed at [facility], [address], [place], [place] [address]     Personally reviewed and showed the patient  US  Abdomen Limited RUQ (LIVER/GB) Result Date: 08/18/2024 CLINICAL DATA:  Right upper quadrant pain. EXAM: ULTRASOUND ABDOMEN LIMITED RIGHT UPPER QUADRANT COMPARISON:  CT abdomen pelvis 08/18/2024 FINDINGS: Gallbladder: Mild gallbladder wall distension. Bladder wall is mildly thickened measuring 0.5 cm. No definite stones. Small amount of pericholecystic fluid. Reportedly, the patient does not have a sonographic Murphy sign. Common bile duct: Diameter: 0.4 cm. Liver: Liver is mildly heterogeneous. No discrete lesion. Echogenicity is within normal limits. Portal vein is patent on color Doppler imaging with normal direction of blood flow towards the liver. Other: None. IMPRESSION: 1. Gallbladder is mildly distended with wall thickening and small amount of pericholecystic fluid. Although the patient does not have a sonographic Murphy sign, findings are concerning for acute acalculous cholecystitis. Recommend clinical correlation. Electronically Signed   By: Juliene Balder M.D.   On: 08/18/2024 09:05   CT ABDOMEN PELVIS W CONTRAST Result Date: 08/18/2024 CLINICAL DATA:  Abdominal pain. EXAM: CT ABDOMEN AND PELVIS WITH CONTRAST TECHNIQUE: Multidetector CT imaging of the abdomen and pelvis was performed using the standard protocol following bolus administration of intravenous contrast. RADIATION DOSE REDUCTION: This exam was performed according to the departmental dose-optimization program which includes automated exposure control, adjustment of the mA and/or kV according to patient size and/or use of iterative reconstruction technique. CONTRAST:  OMNIPAQUE  IOHEXOL  300 MG/ML  SOLN COMPARISON:  None Available. FINDINGS: Lower chest:  No acute findings. Hepatobiliary:  No suspicious focal abnormality within the liver parenchyma. Mild intrahepatic biliary duct prominence. Gallbladder is distended with gallbladder wall thickening/pericholecystic fluid. High  density/hyperenhancement is identified in the region of the cystic duct (axial 29/2 and coronal 59/4). Common bile duct in the head of the pancreas is nondilated. Pancreas: No focal mass lesion. No dilatation of the main duct. No intraparenchymal cyst. No peripancreatic edema. Spleen: No splenomegaly. No suspicious focal mass lesion. Adrenals/Urinary Tract: No adrenal nodule or mass. Punctate nonobstructing stone identified lower pole right kidney. Tiny well-defined homogeneous low-density lesions in the right kidney are too small to characterize but statistically most likely benign and probably cysts. No followup imaging is recommended. Left kidney unremarkable. No evidence for hydroureter. The urinary bladder appears normal for the degree of distention. Stomach/Bowel: Stomach is unremarkable. No gastric wall thickening. No evidence of outlet obstruction. Duodenum is normally positioned as is the ligament of Treitz. No small bowel wall thickening. No small bowel dilatation. The terminal ileum is normal. The appendix is normal. No gross colonic mass. No colonic wall thickening. Diverticular changes are noted in the left colon without evidence of diverticulitis. Vascular/Lymphatic: No abdominal aortic aneurysm. No abdominal aortic atherosclerotic calcification. There is no gastrohepatic or hepatoduodenal ligament lymphadenopathy. No retroperitoneal or mesenteric lymphadenopathy. No pelvic sidewall lymphadenopathy. Reproductive: The prostate gland and seminal vesicles are unremarkable. Other: Trace free fluid is seen in the pelvis. Musculoskeletal: No worrisome lytic or sclerotic osseous abnormality. Avascular necrosis noted in the femoral heads bilaterally. IMPRESSION: 1. Gallbladder is distended with gallbladder wall thickening/pericholecystic fluid. High density/hyperenhancement is identified in the region of the cystic duct. Imaging features are concerning for acute cholecystitis, potentially with stone disease in  the cystic duct versus cystic duct hyperenhancement. Right upper quadrant ultrasound recommended to further evaluate. 2. Mild intrahepatic biliary duct prominence. Common bile duct in the head of the pancreas is nondilated. 3. Punctate nonobstructing stone lower pole right kidney. 4. Left colonic diverticulosis without diverticulitis. 5. 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 Electronically Signed   By: Camellia Candle M.D.   On: 08/18/2024 06:00     Assessment & Plan:  Abdul Beirne. is a 63 y.o. male with concern for cholecystitis. No obvious stones but could have some sludge. His symptoms are consistent with biliary issues.   PLAN: I counseled the patient about the indication, risks and benefits of robotic assisted laparoscopic cholecystectomy.  He understands there is a very small chance for bleeding, infection, injury to normal structures (including common bile duct), conversion to open surgery, persistent symptoms, evolution of postcholecystectomy diarrhea, need for secondary interventions, anesthesia reaction, cardiopulmonary issues and other risks not specifically detailed here. I described the expected recovery, the plan for follow-up and the restrictions during the recovery phase.  All questions were answered.  OR Monday Antibiotics now Soft diet   All questions were answered to the satisfaction of the patient and family.   Manuelita JAYSON Pander 08/18/2024, 2:00 PM

## 2024-08-18 NOTE — ED Triage Notes (Signed)
 Pt with c/o RUQ abdominal pain that started around midnight.

## 2024-08-18 NOTE — ED Provider Notes (Signed)
 [facility] Emergency Department Provider Note MRN:  [id number]  Arrival date & time: 08/18/24     Chief Complaint   Abdominal Pain   History of Present Illness   Aaron Cham. is a 63 y.o. year-old male with a history of hypertension, GERD presenting to the ED with chief complaint of abdominal pain.  Diffuse abdominal pain for the past 5 hours not going away worst in the right upper quadrant.  No nausea vomiting, no fever, no chest pain, no shortness of breath.  Review of Systems  A thorough review of systems was obtained and all systems are negative except as noted in the HPI and PMH.   Patient's Health History    Past Medical History:  Diagnosis Date   GERD (gastroesophageal reflux disease)    Hypertension    Sleep apnea     Past Surgical History:  Procedure Laterality Date   BIOPSY  02/24/2021   Procedure: BIOPSY;  Surgeon: Cindie Carlin POUR, DO;  [facility];  Service: Endoscopy;;   COLONOSCOPY     can not remeber when but it has been awhile, at least 5 years ago   COLONOSCOPY [name] 03/12/2014   Procedure: COLONOSCOPY;  Surgeon: Margo LITTIE Haddock, MD;  [facility];  Service: Endoscopy;  Laterality: [name];  10:00   COLONOSCOPY WITH PROPOFOL  [name] 02/24/2021   Procedure: COLONOSCOPY WITH PROPOFOL ;  Surgeon: Cindie Carlin POUR, DO;  [facility];  Service: Endoscopy;  Laterality: [name];  pm   ESOPHAGOGASTRODUODENOSCOPY (EGD) WITH PROPOFOL  [name] 02/24/2021   Procedure: ESOPHAGOGASTRODUODENOSCOPY (EGD) WITH PROPOFOL ;  Surgeon: Cindie Carlin POUR, DO;  [facility];  Service: Endoscopy;  Laterality: [name];   POLYPECTOMY  02/24/2021   Procedure: POLYPECTOMY;  Surgeon: Cindie Carlin POUR, DO;  [facility];  Service: Endoscopy;;    Family History  Problem Relation Age of Onset   Hypertension Mother    Cancer Father    Colon cancer Neg Hx     Social History   Socioeconomic History   Marital status:  Married    Spouse name: Not on file   Number of children: Not on file   Years of education: Not on file   Highest education level: Not on file  Occupational History   Occupation: [employer]    Employer: [employer]  Tobacco Use   Smoking status: Never   Smokeless tobacco: Never  Substance and Sexual Activity   Alcohol use: No   Drug use: No   Sexual activity: Not on file  Other Topics Concern   Not on file  Social History Narrative   Not on file   Social Drivers of Health   Financial Resource Strain: Not on file  Food Insecurity: Not on file  Transportation Needs: Not on file  Physical Activity: Not on file  Stress: Not on file  Social Connections: Not on file  Intimate Partner Violence: Not on file     Physical Exam   Vitals:   08/18/24 0520 08/18/24 0630  BP:  (!) 158/106  Pulse: 84 74  Resp: 16 16  Temp:    SpO2: 96% 98%    CONSTITUTIONAL: Well-appearing, NAD NEURO/PSYCH:  Alert and oriented x 3, no focal deficits EYES:  eyes equal and reactive ENT/NECK:  no LAD, no JVD CARDIO: Regular rate, well-perfused, normal S1 and S2 PULM:  CTAB no wheezing or rhonchi GI/GU:  non-distended, non-tender MSK/SPINE:  No gross deformities, no edema SKIN:  no rash,  atraumatic   *Additional and/or pertinent findings included in MDM below  Diagnostic and Interventional Summary    EKG Interpretation Date/Time:    Ventricular Rate:    PR Interval:    QRS Duration:    QT Interval:    QTC Calculation:   R Axis:      Text Interpretation:         Labs Reviewed  COMPREHENSIVE METABOLIC PANEL WITH GFR - Abnormal; Notable for the following components:      Result Value   Glucose, Bld 141 (*)    Creatinine, Ser 1.54 (*)    GFR, Estimated 50 (*)    All other components within normal limits  LIPASE, BLOOD  CBC  URINALYSIS, ROUTINE W REFLEX MICROSCOPIC    CT ABDOMEN PELVIS W CONTRAST  Final Result    US  Abdomen Limited RUQ (LIVER/GB)    (Results Pending)    Medications   cefTRIAXone  (ROCEPHIN ) 2 g in sodium chloride  0.9 % 100 mL IVPB (2 g Intravenous New Bag/Given 08/18/24 0701)  metroNIDAZOLE  (FLAGYL ) IVPB 500 mg (has no administration in time range)  morphine  (PF) 4 MG/ML injection 4 mg (4 mg Intravenous Given 08/18/24 0509)  ondansetron  (ZOFRAN ) injection 4 mg (4 mg Intravenous Given 08/18/24 0509)  sodium chloride  0.9 % bolus 1,000 mL (1,000 mLs Intravenous New Bag/Given 08/18/24 0508)  iohexol  (OMNIPAQUE ) 300 MG/ML solution 100 mL (100 mLs Intravenous Contrast Given 08/18/24 0534)     Procedures  /  Critical Care .Critical Care  Performed by: Theadore Ozell HERO, MD Authorized by: Theadore Ozell HERO, MD   Critical care provider statement:    Critical care time (minutes):  35   Critical care was necessary to treat or prevent imminent or life-threatening deterioration of the following conditions: Concern for acute cholecystitis.   Critical care was time spent personally by me on the following activities:  Development of treatment plan with patient or surrogate, discussions with consultants, evaluation of patient's response to treatment, examination of patient, ordering and review of laboratory studies, ordering and review of radiographic studies, ordering and performing treatments and interventions, pulse oximetry, re-evaluation of patient's condition and review of old charts   ED Course and Medical Decision Making  Initial Impression and Ddx Patient is well-appearing in no acute distress.  Quite hypertensive on arrival possibly related to pain.  Differential diagnosis includes biliary colic, cholecystitis, pancreatitis, perforated viscus, atypical presentation of ACS though not having any chest pain and abdominal pain is more diffuse.  Worst in the right upper quadrant.  Past medical/surgical history that increases complexity of ED encounter: None  Interpretation of Diagnostics I personally reviewed the Laboratory Testing and my interpretation is as follows: No  significant blood count or electrolyte disturbance.  CT with evidence to suggest cholecystitis  Patient Reassessment and Ultimate Disposition/Management     Case discussed with Dr. Kallie, plan is for ultrasound, hospitalist admission.  Providing antibiotics.  Patient management required discussion with the following services or consulting groups:  Hospitalist Service and General/Trauma Surgery  Complexity of Problems Addressed Acute illness or injury that poses threat of life of bodily function  Additional Data Reviewed and Analyzed Further history obtained from: Further history from spouse/family member  Additional Factors Impacting ED Encounter Risk Use of parenteral controlled substances and Consideration of hospitalization  Ozell HERO. Theadore, MD [facility] Emergency Medicine [facility] [email or web address]  Final Clinical Impressions(s) / ED Diagnoses     ICD-10-CM   1. Cholecystitis  K81.9  ED Discharge Orders     None        Discharge Instructions Discussed with and Provided to Patient:   Discharge Instructions   None      Theadore Ozell HERO, MD 08/18/24 [id number]

## 2024-08-18 NOTE — H&P (Addendum)
 History and Physical    Patient: Aaron Peck. FMW:[id number] DOB: Mar 25, 1961 DOA: 08/18/2024 DOS: the patient was seen and examined on 08/18/2024 PCP: [facility]  Patient coming from: Home  Chief Complaint:  Chief Complaint  Patient presents with   Abdominal Pain   HPI: Aaron Peck. is a 63 y.o. 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 All other systems reviewed and are negative. Past Medical History:  Diagnosis Date   GERD (gastroesophageal reflux disease)    Hypertension    Sleep apnea    Past Surgical History:  Procedure Laterality Date   BIOPSY  02/24/2021   Procedure: BIOPSY;  Surgeon: Cindie Carlin POUR, DO;  Location: [facility];  Service: Endoscopy;;   COLONOSCOPY     can not remeber when but it has been awhile, at least 5 years ago   COLONOSCOPY [name] 03/12/2014   Procedure: COLONOSCOPY;  Surgeon: Margo LITTIE Haddock, MD;  Location: [facility];  Service: Endoscopy;  Laterality: [name];  10:00   COLONOSCOPY WITH PROPOFOL  [name] 02/24/2021   Procedure: COLONOSCOPY WITH PROPOFOL ;  Surgeon: Cindie Carlin POUR, DO;  Location: [facility];  Service: Endoscopy;  Laterality: [name];  pm   ESOPHAGOGASTRODUODENOSCOPY (EGD) WITH PROPOFOL  [name] 02/24/2021   Procedure: ESOPHAGOGASTRODUODENOSCOPY (EGD) WITH PROPOFOL ;   Surgeon: Cindie Carlin POUR, DO;  Location: [facility];  Service: Endoscopy;  Laterality: [name];   POLYPECTOMY  02/24/2021   Procedure: POLYPECTOMY;  Surgeon: Cindie Carlin POUR, DO;  Location: [facility];  Service: Endoscopy;;   Social History:  reports that he has never smoked. He has never used smokeless tobacco. He reports that he does not drink alcohol and does not use drugs.  No Known Allergies  Family History  Problem Relation Age of Onset   Hypertension Mother    Cancer Father    Colon cancer Neg Hx     Prior to Admission medications   Medication Sig Start Date End Date Taking? Authorizing Provider  acetaminophen  (TYLENOL ) 500 MG tablet Take 1,000 mg by mouth every 6 (six) hours as needed for mild pain (pain score 1-3).   Yes [provider]  aspirin  EC 81 MG tablet Take 81 mg by mouth daily. Swallow whole.   Yes [provider]  carvedilol  (COREG ) 6.25 MG tablet Take 6.25 mg by mouth in the morning and at bedtime.   Yes [provider]  cholecalciferol  (VITAMIN D ) 25 MCG (1000 UNIT) tablet Take 1,000 Units by mouth daily. 10/21/20  Yes [provider]  Flaxseed, Linseed, (FLAX SEED OIL PO) Take 1 capsule by mouth daily.   Yes [provider]  losartan  (COZAAR ) 100 MG tablet Take 100 mg by mouth daily. 08/09/24  Yes [provider]  Multiple Vitamin (MULTIVITAMIN WITH MINERALS) TABS tablet Take 1 tablet by mouth daily.   Yes [provider]  omeprazole  (PRILOSEC) 20 MG capsule Take  20 mg by mouth daily.   Yes [provider]  lisinopril (PRINIVIL,ZESTRIL) 20 MG tablet Take 20 mg by mouth daily.  02/16/20  [provider]    Physical Exam: Vitals:   08/18/24 0910 08/18/24 1129 08/18/24 1347 08/18/24 1649  BP: (!) 193/107 (!) 149/98 (!) 162/94 (!) 165/100  Pulse: 64  78 85  Resp: 15     Temp: 97.8 F (36.6 C)  98.2 F (36.8 C)   TempSrc: Oral  Oral   SpO2: 97%  98% 97%  Weight: 90.3 kg      Height:        Physical Exam  Gen:- Awake Alert, in no acute distress  HEENT:- [place].AT, No sclera icterus Neck-Supple Neck,No JVD,.  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  I have counseled the patient regarding the negative impacts of continued tobacco use including but not limited to lung cancer, COPD, and cardiovascular disease.  I have discussed alternatives to tobacco and modalities that may help facilitate tobacco cessation including but not limited to biofeedback, hypnosis, and medications.  Total time spent with tobacco counseling was 4 minutes.  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     Advance Care Planning:   Code Status: Full Code   Consults: General surgery  Family Communication: None available at this time at bedside  Severity of Illness: The appropriate patient status for this patient is INPATIENT. Inpatient status is judged to be reasonable and necessary in order to provide the required intensity of service to ensure the patient's safety. The patient's presenting symptoms, physical exam findings, and initial radiographic and laboratory data in the context of their chronic comorbidities is felt to place them at high risk for further clinical deterioration. Furthermore, it is not anticipated that the patient will be medically stable for discharge from the hospital within 2 midnights of admission.   * I certify that at the point of admission it is my clinical judgment that the patient will require inpatient hospital care spanning beyond 2 midnights from the point of admission due to high intensity of service, high risk for further deterioration and high frequency of surveillance required.*  Author: Rendall Carwin, MD 08/18/2024 6:44 PM  For on call review [email or web address].

## 2024-08-19 DIAGNOSIS — K810 Acute cholecystitis: Secondary | ICD-10-CM | POA: Diagnosis not present

## 2024-08-19 DIAGNOSIS — K811 Chronic cholecystitis: Secondary | ICD-10-CM | POA: Diagnosis not present

## 2024-08-19 LAB — COMPREHENSIVE METABOLIC PANEL WITH GFR
ALT: 13 U/L (ref 0–44)
AST: 12 U/L — ABNORMAL LOW (ref 15–41)
Albumin: 3.1 g/dL — ABNORMAL LOW (ref 3.5–5.0)
Alkaline Phosphatase: 54 U/L (ref 38–126)
Anion gap: 5 (ref 5–15)
BUN: 12 mg/dL (ref 8–23)
CO2: 25 mmol/L (ref 22–32)
Calcium: 8.8 mg/dL — ABNORMAL LOW (ref 8.9–10.3)
Chloride: 107 mmol/L (ref 98–111)
Creatinine, Ser: 1.32 mg/dL — ABNORMAL HIGH (ref 0.61–1.24)
GFR, Estimated: >60 mL/min (ref >60)
Glucose, Bld: 112 mg/dL — ABNORMAL HIGH (ref 70–99)
Potassium: 3.4 mmol/L — ABNORMAL LOW (ref 3.5–5.1)
Sodium: 137 mmol/L (ref 135–145)
Total Bilirubin: 0.6 mg/dL (ref 0.0–1.2)
Total Protein: 6.4 g/dL — ABNORMAL LOW (ref 6.5–8.1)

## 2024-08-19 LAB — CBC
HCT: 43.7 % (ref 39.0–52.0)
Hemoglobin: 14.4 g/dL (ref 13.0–17.0)
MCH: 31.2 pg (ref 26.0–34.0)
MCHC: 33 g/dL (ref 30.0–36.0)
MCV: 94.6 fL (ref 80.0–100.0)
Platelets: 195 K/uL (ref 150–400)
RBC: 4.62 MIL/uL (ref 4.22–5.81)
RDW: 13.7 % (ref 11.5–15.5)
WBC: 7.1 K/uL (ref 4.0–10.5)
nRBC: 0 % (ref 0.0–0.2)

## 2024-08-19 MED ORDER — CHLORHEXIDINE GLUCONATE CLOTH 2 % EX PADS
6.0000 | MEDICATED_PAD | Freq: Once | CUTANEOUS | Status: AC
  Administered 2024-08-19: 6 via TOPICAL

## 2024-08-19 MED ORDER — SODIUM CHLORIDE 0.9 % IV SOLN
2.0000 g | INTRAVENOUS | Status: AC
  Administered 2024-08-20: 2 g via INTRAVENOUS
  Filled 2024-08-19: qty 2

## 2024-08-19 MED ORDER — INDOCYANINE GREEN 25 MG IV SOLR
2.5000 mg | Freq: Once | INTRAVENOUS | Status: DC
  Filled 2024-08-19: qty 10

## 2024-08-19 MED ORDER — SODIUM CHLORIDE 0.9 % IV SOLN: INTRAVENOUS | Status: AC

## 2024-08-19 NOTE — Progress Notes (Signed)
   08/19/24 0856  TOC Brief Assessment  Insurance and Status Reviewed  Patient has primary care physician Yes  Home environment has been reviewed From home  Prior level of function: Independent  Prior/Current Home Services No current home services  Social Drivers of Health Review SDOH reviewed no interventions necessary  Readmission risk has been reviewed Yes  Transition of care needs no transition of care needs at this time   Transition of Care Department [facility]) has reviewed patient and no TOC needs have been identified at this time. We will continue to monitor patient advancement through interdisciplinary progression rounds. If new patient transition needs arise, please place a TOC consult.

## 2024-08-19 NOTE — Plan of Care (Signed)

## 2024-08-19 NOTE — Progress Notes (Signed)
 [facility] Progress Note     Subjective: Eating and no major issues.   Objective: Vital signs in last 24 hours: Temp:  [98.2 F (36.8 C)-98.6 F (37 C)] 98.4 F (36.9 C) (09/07 0357) Pulse Rate:  [78-85] 84 (09/07 0357) Resp:  [19-20] 19 (09/07 0357) BP: (134-165)/(83-100) 134/86 (09/07 0357) SpO2:  [93 %-98 %] 93 % (09/07 0357) Last BM Date : 08/18/24  Intake/Output from previous day: 09/06 0701 - 09/07 0700 In: 240 [P.O.:240] Out: -  Intake/Output this shift: No intake/output data recorded.  General appearance: alert and no distress GI: soft, nondistended, minimally tender   Lab Results:  Recent Labs    08/18/24 0500 08/19/24 0348  WBC 7.6 7.1  HGB 15.5 14.4  HCT 46.6 43.7  PLT 233 195   BMET Recent Labs    08/18/24 0500 08/19/24 0348  NA 141 137  K 3.7 3.4*  CL 105 107  CO2 25 25  GLUCOSE 141* 112*  BUN 17 12  CREATININE 1.54* 1.32*  CALCIUM 9.6 8.8*   PT/INR No results for input(s): LABPROT, INR in the last 72 hours.  Studies/Results: US  Abdomen Limited RUQ (LIVER/GB) Result Date: 08/18/2024 CLINICAL DATA:  Right upper quadrant pain. EXAM: ULTRASOUND ABDOMEN LIMITED RIGHT UPPER QUADRANT COMPARISON:  CT abdomen pelvis 08/18/2024 FINDINGS: Gallbladder: Mild gallbladder wall distension. Bladder wall is mildly thickened measuring 0.5 cm. No definite stones. Small amount of pericholecystic fluid. Reportedly, the patient does not have a sonographic Murphy sign. Common bile duct: Diameter: 0.4 cm. Liver: Liver is mildly heterogeneous. No discrete lesion. Echogenicity is within normal limits. Portal vein is patent on color Doppler imaging with normal direction of blood flow towards the liver. Other: None. IMPRESSION: 1. Gallbladder is mildly distended with wall thickening and small amount of pericholecystic fluid. Although the patient does not have a sonographic Murphy sign, findings are concerning for acute acalculous cholecystitis.  Recommend clinical correlation. Electronically Signed   By: Juliene Balder M.D.   On: 08/18/2024 09:05   CT ABDOMEN PELVIS W CONTRAST Result Date: 08/18/2024 CLINICAL DATA:  Abdominal pain. EXAM: CT ABDOMEN AND PELVIS WITH CONTRAST TECHNIQUE: Multidetector CT imaging of the abdomen and pelvis was performed using the standard protocol following bolus administration of intravenous contrast. RADIATION DOSE REDUCTION: This exam was performed according to the departmental dose-optimization program which includes automated exposure control, adjustment of the mA and/or kV according to patient size and/or use of iterative reconstruction technique. CONTRAST:  OMNIPAQUE  IOHEXOL  300 MG/ML  SOLN COMPARISON:  None Available. FINDINGS: Lower chest:  No acute findings. Hepatobiliary: No suspicious focal abnormality within the liver parenchyma. Mild intrahepatic biliary duct prominence. Gallbladder is distended with gallbladder wall thickening/pericholecystic fluid. High density/hyperenhancement is identified in the region of the cystic duct (axial 29/2 and coronal 59/4). Common bile duct in the head of the pancreas is nondilated. Pancreas: No focal mass lesion. No dilatation of the main duct. No intraparenchymal cyst. No peripancreatic edema. Spleen: No splenomegaly. No suspicious focal mass lesion. Adrenals/Urinary Tract: No adrenal nodule or mass. Punctate nonobstructing stone identified lower pole right kidney. Tiny well-defined homogeneous low-density lesions in the right kidney are too small to characterize but statistically most likely benign and probably cysts. No followup imaging is recommended. Left kidney unremarkable. No evidence for hydroureter. The urinary bladder appears normal for the degree of distention. Stomach/Bowel: Stomach is unremarkable. No gastric wall thickening. No evidence of outlet obstruction. Duodenum is normally positioned as is the ligament of Treitz. No small bowel  wall thickening. No small  bowel dilatation. The terminal ileum is normal. The appendix is normal. No gross colonic mass. No colonic wall thickening. Diverticular changes are noted in the left colon without evidence of diverticulitis. Vascular/Lymphatic: No abdominal aortic aneurysm. No abdominal aortic atherosclerotic calcification. There is no gastrohepatic or hepatoduodenal ligament lymphadenopathy. No retroperitoneal or mesenteric lymphadenopathy. No pelvic sidewall lymphadenopathy. Reproductive: The prostate gland and seminal vesicles are unremarkable. Other: Trace free fluid is seen in the pelvis. Musculoskeletal: No worrisome lytic or sclerotic osseous abnormality. Avascular necrosis noted in the femoral heads bilaterally. IMPRESSION: 1. Gallbladder is distended with gallbladder wall thickening/pericholecystic fluid. High density/hyperenhancement is identified in the region of the cystic duct. Imaging features are concerning for acute cholecystitis, potentially with stone disease in the cystic duct versus cystic duct hyperenhancement. Right upper quadrant ultrasound recommended to further evaluate. 2. Mild intrahepatic biliary duct prominence. Common bile duct in the head of the pancreas is nondilated. 3. Punctate nonobstructing stone lower pole right kidney. 4. Left colonic diverticulosis without diverticulitis. 5. 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 Electronically Signed   By: Camellia Candle M.D.   On: 08/18/2024 06:00    Anti-infectives: Anti-infectives (From admission, onward)    Start     Dose/Rate Route Frequency Ordered Stop   08/19/24 0600  cefTRIAXone  (ROCEPHIN ) 2 g in sodium chloride  0.9 % 100 mL IVPB        2 g 200 mL/hr over 30 Minutes Intravenous Every 24 hours 08/18/24 0818 08/23/24 0559   08/18/24 2000  metroNIDAZOLE  (FLAGYL ) IVPB 500 mg        500 mg 100 mL/hr over 60 Minutes Intravenous Every 12 hours 08/18/24 0818 08/23/24 0959   08/18/24 0700  cefTRIAXone  (ROCEPHIN ) 2 g in sodium chloride   0.9 % 100 mL IVPB        2 g 200 mL/hr over 30 Minutes Intravenous  Once 08/18/24 0656 08/18/24 0739   08/18/24 0700  metroNIDAZOLE  (FLAGYL ) IVPB 500 mg        500 mg 100 mL/hr over 60 Minutes Intravenous  Once 08/18/24 0656 08/18/24 9160       Assessment/Plan: Patient with acute cholecystitis and plan for cholecystectomy tomorrow.  All questions answered. NPO midnight.    LOS: 1 day    Manuelita JAYSON Pander 08/19/2024

## 2024-08-19 NOTE — Progress Notes (Signed)
 PROGRESS NOTE  Aaron Peck, is a 63 y.o. male, DOB - 09/07/1961, FMW:[id number]  Admit date - 08/18/2024   Admitting Physician Tempestt Silba Pearlean, MD  Outpatient Primary MD for the patient is [facility]  LOS - 1  Chief Complaint  Patient presents with   Abdominal Pain      Brief Narrative:  63 y.o. male with medical history relevant for tobacco abuse, class I obesity, GERD, OSA and HTN admitted on 08/18/2024 with acute cholecystitis    -Assessment and Plan: 1) acute acalculous cholecystitis--POA -continue Rocephin  and Flagyl  - General Surgery consult appreciated - Tentatively plan is for lap chole on 08/20/2024 - Continue as needed pain medications and antiemetics --N.p.o. after midnight for lap chole in am  -  2)GERD--continue Protonix    3)HTN--- hold losartan /HCTZ given contrast exposure - c/n  nifedipine  and Coreg    4)Tobacco Abuse---  -Abstinence from cigars advised.   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   Disposition: The patient is from: Home              Anticipated d/c is to: Home              Anticipated d/c date is: 1 day              Patient currently is not medically stable to d/c. Barriers: Not Clinically Stable-   Code Status :  -  Code Status: Full Code   Family Communication:   NA (patient is alert, awake and coherent)   DVT Prophylaxis  :   - SCDs   SCD's Start: 08/19/24 1138 heparin  injection 5,000 Units Start: 08/18/24 2200 SCDs Start: 08/18/24 0817 Place TED hose Start: 08/18/24 0817   Lab Results  Component Value Date   PLT 195 08/19/2024    Inpatient Medications  Scheduled Meds:  carvedilol   6.25 mg Oral BID WC   Chlorhexidine  Gluconate Cloth  6 each Topical Once   And    Chlorhexidine  Gluconate Cloth  6 each Topical Once   cholecalciferol   1,000 Units Oral Daily   heparin   5,000 Units Subcutaneous Q8H   [START ON 08/20/2024] indocyanine green   2.5 mg Intravenous Once   multivitamin with minerals  1 tablet Oral Daily   NIFEdipine   90 mg Oral Daily   pantoprazole   40 mg Oral Daily   sodium chloride  flush  3 mL Intravenous Q12H   sodium chloride  flush  3 mL Intravenous Q12H   Continuous Infusions:  sodium chloride  83 mL/hr at 08/19/24 0932   [START ON 08/20/2024] cefoTEtan  (CEFOTAN ) IV     cefTRIAXone  (ROCEPHIN )  IV 2 g (08/19/24 0523)   metroNIDAZOLE  500 mg (08/19/24 0938)   PRN Meds:.acetaminophen  **OR** acetaminophen , bisacodyl , hydrALAZINE , HYDROmorphone  (DILAUDID ) injection, ondansetron  **OR** ondansetron  (ZOFRAN ) IV, oxyCODONE , polyethylene glycol, sodium chloride  flush   Anti-infectives (From admission, onward)    Start     Dose/Rate Route Frequency Ordered Stop   08/20/24 1130  cefoTEtan  (CEFOTAN ) 2 g in sodium chloride  0.9 % 100 mL IVPB        2 g 200 mL/hr over 30 Minutes Intravenous On call to O.R. 08/19/24 1137 08/21/24 0559   08/19/24 0600  cefTRIAXone  (ROCEPHIN ) 2 g in sodium chloride  0.9 % 100 mL IVPB        2 g  200 mL/hr over 30 Minutes Intravenous Every 24 hours 08/18/24 0818 08/23/24 0559   08/18/24 2000  metroNIDAZOLE  (FLAGYL ) IVPB 500 mg        500 mg 100 mL/hr over 60 Minutes Intravenous Every 12 hours 08/18/24 0818 08/23/24 0959   08/18/24 0700  cefTRIAXone  (ROCEPHIN ) 2 g in sodium chloride  0.9 % 100 mL IVPB        2 g 200 mL/hr over 30 Minutes Intravenous  Once 08/18/24 0656 08/18/24 0739   08/18/24 0700  metroNIDAZOLE  (FLAGYL ) IVPB 500 mg        500 mg 100 mL/hr over 60 Minutes Intravenous  Once 08/18/24 0656 08/18/24 9160         Subjective: Aaron Peck today has no fevers, no emesis,  No chest pain,   - Tolerating oral intake well - Improved abdominal pain   Objective: Vitals:   08/18/24 1347 08/18/24 1649  08/18/24 1941 08/19/24 0357  BP: (!) 162/94 (!) 165/100 136/83 134/86  Pulse: 78 85 82 84  Resp:   20 19  Temp: 98.2 F (36.8 C)  98.6 F (37 C) 98.4 F (36.9 C)  TempSrc: Oral   Oral  SpO2: 98% 97% 96% 93%  Weight:      Height:        Intake/Output Summary (Last 24 hours) at 08/19/2024 1336 Last data filed at 08/19/2024 0900 Gross per 24 hour  Intake 480 ml  Output --  Net 480 ml   Filed Weights   08/18/24 0445 08/18/24 0910  Weight: 89.8 kg 90.3 kg    Physical Exam  Gen:- Awake Alert,  in no apparent distress  HEENT:- [place].AT, No sclera icterus Neck-Supple Neck,No JVD,.  Lungs-  CTAB , fair symmetrical air movement CV- S1, S2 normal, regular  Abd-  +ve B.Sounds, Abd Soft, improving tenderness,    Extremity/Skin:- No  edema, pedal pulses present  Psych-affect is appropriate, oriented x3 Neuro-no new focal deficits, no tremors  Data Reviewed: I have personally reviewed following labs and imaging studies  CBC: Recent Labs  Lab 08/18/24 0500 08/19/24 0348  WBC 7.6 7.1  HGB 15.5 14.4  HCT 46.6 43.7  MCV 93.2 94.6  PLT 233 195   Basic Metabolic Panel: Recent Labs  Lab 08/18/24 0500 08/19/24 0348  NA 141 137  K 3.7 3.4*  CL 105 107  CO2 25 25  GLUCOSE 141* 112*  BUN 17 12  CREATININE 1.54* 1.32*  CALCIUM 9.6 8.8*   GFR: Estimated Creatinine Clearance: 57.3 mL/min (A) (by C-G formula based on SCr of 1.32 mg/dL (H)). Liver Function Tests: Recent Labs  Lab 08/18/24 0500 08/19/24 0348  AST 17 12*  ALT 15 13  ALKPHOS 67 54  BILITOT 0.5 0.6  PROT 7.5 6.4*  ALBUMIN  3.8 3.1*   Cardiac Enzymes: No results for input(s): CKTOTAL, CKMB, CKMBINDEX, TROPONINI in the last 168 hours. BNP (last 3 results) No results for input(s): PROBNP in the last 8760 hours. HbA1C: No results for input(s): HGBA1C in the last 72 hours. Sepsis Labs: @LABRCNTIP (procalcitonin:4,lacticidven:4) ) Recent Results (from the past 240 hours)  MRSA Next Gen by PCR, Nasal      Status: None   Collection Time: 08/18/24 12:14 PM   Specimen: Nasal Mucosa; Nasal Swab  Result Value Ref Range Status   MRSA by PCR Next Gen NOT DETECTED NOT DETECTED Final    Comment: (NOTE) The GeneXpert MRSA Assay (FDA approved for NASAL specimens only), is one component of a comprehensive MRSA colonization surveillance  program. It is not intended to diagnose MRSA infection nor to guide or monitor treatment for MRSA infections. Test performance is not FDA approved in patients less than 18 years old. Performed at [facility], [address] [place], [place] [address]     Radiology Studies: US  Abdomen Limited RUQ (LIVER/GB) Result Date: 08/18/2024 CLINICAL DATA:  Right upper quadrant pain. EXAM: ULTRASOUND ABDOMEN LIMITED RIGHT UPPER QUADRANT COMPARISON:  CT abdomen pelvis 08/18/2024 FINDINGS: Gallbladder: Mild gallbladder wall distension. Bladder wall is mildly thickened measuring 0.5 cm. No definite stones. Small amount of pericholecystic fluid. Reportedly, the patient does not have a sonographic Murphy sign. Common bile duct: Diameter: 0.4 cm. Liver: Liver is mildly heterogeneous. No discrete lesion. Echogenicity is within normal limits. Portal vein is patent on color Doppler imaging with normal direction of blood flow towards the liver. Other: None. IMPRESSION: 1. Gallbladder is mildly distended with wall thickening and small amount of pericholecystic fluid. Although the patient does not have a sonographic Murphy sign, findings are concerning for acute acalculous cholecystitis. Recommend clinical correlation. Electronically Signed   By: Juliene Balder M.D.   On: 08/18/2024 09:05   CT ABDOMEN PELVIS W CONTRAST Result Date: 08/18/2024 CLINICAL DATA:  Abdominal pain. EXAM: CT ABDOMEN AND PELVIS WITH CONTRAST TECHNIQUE: Multidetector CT imaging of the abdomen and pelvis was performed using the standard protocol following bolus administration of intravenous contrast. RADIATION DOSE REDUCTION: This  exam was performed according to the departmental dose-optimization program which includes automated exposure control, adjustment of the mA and/or kV according to patient size and/or use of iterative reconstruction technique. CONTRAST:  OMNIPAQUE  IOHEXOL  300 MG/ML  SOLN COMPARISON:  None Available. FINDINGS: Lower chest:  No acute findings. Hepatobiliary: No suspicious focal abnormality within the liver parenchyma. Mild intrahepatic biliary duct prominence. Gallbladder is distended with gallbladder wall thickening/pericholecystic fluid. High density/hyperenhancement is identified in the region of the cystic duct (axial 29/2 and coronal 59/4). Common bile duct in the head of the pancreas is nondilated. Pancreas: No focal mass lesion. No dilatation of the main duct. No intraparenchymal cyst. No peripancreatic edema. Spleen: No splenomegaly. No suspicious focal mass lesion. Adrenals/Urinary Tract: No adrenal nodule or mass. Punctate nonobstructing stone identified lower pole right kidney. Tiny well-defined homogeneous low-density lesions in the right kidney are too small to characterize but statistically most likely benign and probably cysts. No followup imaging is recommended. Left kidney unremarkable. No evidence for hydroureter. The urinary bladder appears normal for the degree of distention. Stomach/Bowel: Stomach is unremarkable. No gastric wall thickening. No evidence of outlet obstruction. Duodenum is normally positioned as is the ligament of Treitz. No small bowel wall thickening. No small bowel dilatation. The terminal ileum is normal. The appendix is normal. No gross colonic mass. No colonic wall thickening. Diverticular changes are noted in the left colon without evidence of diverticulitis. Vascular/Lymphatic: No abdominal aortic aneurysm. No abdominal aortic atherosclerotic calcification. There is no gastrohepatic or hepatoduodenal ligament lymphadenopathy. No retroperitoneal or mesenteric  lymphadenopathy. No pelvic sidewall lymphadenopathy. Reproductive: The prostate gland and seminal vesicles are unremarkable. Other: Trace free fluid is seen in the pelvis. Musculoskeletal: No worrisome lytic or sclerotic osseous abnormality. Avascular necrosis noted in the femoral heads bilaterally. IMPRESSION: 1. Gallbladder is distended with gallbladder wall thickening/pericholecystic fluid. High density/hyperenhancement is identified in the region of the cystic duct. Imaging features are concerning for acute cholecystitis, potentially with stone disease in the cystic duct versus cystic duct hyperenhancement. Right upper quadrant ultrasound recommended to further evaluate. 2. Mild intrahepatic  biliary duct prominence. Common bile duct in the head of the pancreas is nondilated. 3. Punctate nonobstructing stone lower pole right kidney. 4. Left colonic diverticulosis without diverticulitis. 5. 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 Electronically Signed   By: Camellia Candle M.D.   On: 08/18/2024 06:00   Scheduled Meds:  carvedilol   6.25 mg Oral BID WC   Chlorhexidine  Gluconate Cloth  6 each Topical Once   And   Chlorhexidine  Gluconate Cloth  6 each Topical Once   cholecalciferol   1,000 Units Oral Daily   heparin   5,000 Units Subcutaneous Q8H   [START ON 08/20/2024] indocyanine green   2.5 mg Intravenous Once   multivitamin with minerals  1 tablet Oral Daily   NIFEdipine   90 mg Oral Daily   pantoprazole   40 mg Oral Daily   sodium chloride  flush  3 mL Intravenous Q12H   sodium chloride  flush  3 mL Intravenous Q12H   Continuous Infusions:  sodium chloride  83 mL/hr at 08/19/24 0932   [START ON 08/20/2024] cefoTEtan  (CEFOTAN ) IV     cefTRIAXone  (ROCEPHIN )  IV 2 g (08/19/24 0523)   metroNIDAZOLE  500 mg (08/19/24 0938)    LOS: 1 day   Rendall Carwin M.D on 08/19/2024 at 1:36 PM  Go to [email or web address] - for contact info  [facility] - Office  [phone]  If 7PM-7AM, please contact  night-coverage [email or web address] 08/19/2024, 1:36 PM

## 2024-08-20 ENCOUNTER — Other Ambulatory Visit: Payer: Self-pay

## 2024-08-20 ENCOUNTER — Observation Stay (HOSPITAL_COMMUNITY): Admitting: Certified Registered"

## 2024-08-20 ENCOUNTER — Observation Stay (HOSPITAL_BASED_OUTPATIENT_CLINIC_OR_DEPARTMENT_OTHER): Admitting: Certified Registered"

## 2024-08-20 ENCOUNTER — Encounter (HOSPITAL_COMMUNITY)
Admission: EM | Disposition: A | Discharge status: Home / Self Care | Payer: Self-pay | Source: Home / Self Care | Attending: Emergency Medicine

## 2024-08-20 ENCOUNTER — Encounter (HOSPITAL_COMMUNITY): Payer: Self-pay | Admitting: Family Medicine

## 2024-08-20 DIAGNOSIS — K810 Acute cholecystitis: Secondary | ICD-10-CM

## 2024-08-20 DIAGNOSIS — K811 Chronic cholecystitis: Secondary | ICD-10-CM | POA: Diagnosis not present

## 2024-08-20 LAB — GLUCOSE, CAPILLARY
Glucose-Capillary: 105 mg/dL — ABNORMAL HIGH (ref 70–99)
Glucose-Capillary: 117 mg/dL — ABNORMAL HIGH (ref 70–99)

## 2024-08-20 SURGERY — CHOLECYSTECTOMY, ROBOT-ASSISTED, LAPAROSCOPIC
Anesthesia: General | Site: Abdomen

## 2024-08-20 MED ORDER — OMEPRAZOLE 20 MG PO CPDR: 20.0000 mg | DELAYED_RELEASE_CAPSULE | Freq: Every day | ORAL | 5 refills | Status: AC

## 2024-08-20 MED ORDER — ONDANSETRON HCL 4 MG PO TABS: 4.0000 mg | ORAL_TABLET | Freq: Four times a day (QID) | ORAL | 0 refills | Status: DC | PRN

## 2024-08-20 MED ORDER — SUGAMMADEX SODIUM 200 MG/2ML IV SOLN
INTRAVENOUS | Status: AC
  Filled 2024-08-20: qty 2

## 2024-08-20 MED ORDER — CHLORHEXIDINE GLUCONATE 0.12 % MT SOLN: 15.0000 mL | Freq: Once | OROMUCOSAL | Status: DC

## 2024-08-20 MED ORDER — PROPOFOL 10 MG/ML IV BOLUS
INTRAVENOUS | Status: DC | PRN
  Administered 2024-08-20: 200 mg via INTRAVENOUS

## 2024-08-20 MED ORDER — FENTANYL CITRATE (PF) 100 MCG/2ML IJ SOLN
INTRAMUSCULAR | Status: DC | PRN
  Administered 2024-08-20: 75 ug via INTRAVENOUS
  Administered 2024-08-20: 25 ug via INTRAVENOUS
  Administered 2024-08-20 (×2): 50 ug via INTRAVENOUS

## 2024-08-20 MED ORDER — ONDANSETRON HCL 4 MG/2ML IJ SOLN
INTRAMUSCULAR | Status: AC
  Filled 2024-08-20: qty 2

## 2024-08-20 MED ORDER — OXYCODONE HCL 5 MG PO TABS: 5.0000 mg | ORAL_TABLET | Freq: Once | ORAL | Status: DC | PRN

## 2024-08-20 MED ORDER — MIDAZOLAM HCL 2 MG/2ML IJ SOLN
INTRAMUSCULAR | Status: AC
  Filled 2024-08-20: qty 2

## 2024-08-20 MED ORDER — OXYCODONE HCL 5 MG/5ML PO SOLN: 5.0000 mg | Freq: Once | ORAL | Status: DC | PRN

## 2024-08-20 MED ORDER — ROCURONIUM BROMIDE 10 MG/ML (PF) SYRINGE
PREFILLED_SYRINGE | INTRAVENOUS | Status: DC | PRN
  Administered 2024-08-20: 60 mg via INTRAVENOUS
  Administered 2024-08-20 (×2): 10 mg via INTRAVENOUS

## 2024-08-20 MED ORDER — LIDOCAINE 2% (20 MG/ML) 5 ML SYRINGE
INTRAMUSCULAR | Status: AC
  Filled 2024-08-20: qty 5

## 2024-08-20 MED ORDER — LACTATED RINGERS IV SOLN: INTRAVENOUS | Status: DC

## 2024-08-20 MED ORDER — ORAL CARE MOUTH RINSE: 15.0000 mL | Freq: Once | OROMUCOSAL | Status: DC

## 2024-08-20 MED ORDER — BUPIVACAINE HCL (PF) 0.5 % IJ SOLN
INTRAMUSCULAR | Status: AC
  Filled 2024-08-20: qty 30

## 2024-08-20 MED ORDER — FENTANYL CITRATE (PF) 100 MCG/2ML IJ SOLN
INTRAMUSCULAR | Status: AC
  Filled 2024-08-20: qty 2

## 2024-08-20 MED ORDER — MIDAZOLAM HCL 2 MG/2ML IJ SOLN
INTRAMUSCULAR | Status: DC | PRN
  Administered 2024-08-20: 2 mg via INTRAVENOUS

## 2024-08-20 MED ORDER — ESMOLOL HCL 100 MG/10ML IV SOLN
INTRAVENOUS | Status: DC | PRN
  Administered 2024-08-20: 20 mg via INTRAVENOUS

## 2024-08-20 MED ORDER — SUGAMMADEX SODIUM 200 MG/2ML IV SOLN
INTRAVENOUS | Status: DC | PRN
Start: 2024-08-20 — End: 2024-08-20
  Administered 2024-08-20: 200 mg via INTRAVENOUS

## 2024-08-20 MED ORDER — SUCCINYLCHOLINE CHLORIDE 200 MG/10ML IV SOSY
PREFILLED_SYRINGE | INTRAVENOUS | Status: DC | PRN
  Administered 2024-08-20: 160 mg via INTRAVENOUS

## 2024-08-20 MED ORDER — EPHEDRINE 5 MG/ML INJ
INTRAVENOUS | Status: AC
  Filled 2024-08-20: qty 5

## 2024-08-20 MED ORDER — DEXAMETHASONE SODIUM PHOSPHATE 10 MG/ML IJ SOLN
INTRAMUSCULAR | Status: AC
  Filled 2024-08-20: qty 1

## 2024-08-20 MED ORDER — PROPOFOL 10 MG/ML IV BOLUS
INTRAVENOUS | Status: AC
Start: 2024-08-20 — End: 2024-08-20
  Filled 2024-08-20: qty 20

## 2024-08-20 MED ORDER — OXYCODONE HCL 5 MG PO TABS: 5.0000 mg | ORAL_TABLET | ORAL | 0 refills | Status: DC | PRN

## 2024-08-20 MED ORDER — ONDANSETRON HCL 4 MG/2ML IJ SOLN
INTRAMUSCULAR | Status: DC | PRN
  Administered 2024-08-20: 4 mg via INTRAVENOUS

## 2024-08-20 MED ORDER — HYDROMORPHONE HCL 1 MG/ML IJ SOLN
0.2500 mg | INTRAMUSCULAR | Status: DC | PRN
  Administered 2024-08-20: 0.5 mg via INTRAVENOUS
  Filled 2024-08-20: qty 0.5

## 2024-08-20 MED ORDER — CARVEDILOL 6.25 MG PO TABS: 6.2500 mg | ORAL_TABLET | Freq: Two times a day (BID) | ORAL | 5 refills | Status: AC

## 2024-08-20 MED ORDER — PHENYLEPHRINE HCL (PRESSORS) 10 MG/ML IV SOLN
INTRAVENOUS | Status: DC | PRN
Start: 2024-08-20 — End: 2024-08-20
  Administered 2024-08-20: 120 ug via INTRAVENOUS

## 2024-08-20 MED ORDER — SUCCINYLCHOLINE CHLORIDE 200 MG/10ML IV SOSY
PREFILLED_SYRINGE | INTRAVENOUS | Status: AC
  Filled 2024-08-20: qty 10

## 2024-08-20 MED ORDER — ESMOLOL HCL 100 MG/10ML IV SOLN
INTRAVENOUS | Status: AC
  Filled 2024-08-20: qty 10

## 2024-08-20 MED ORDER — INDOCYANINE GREEN 25 MG IV SOLR
INTRAVENOUS | Status: AC
  Filled 2024-08-20: qty 10

## 2024-08-20 MED ORDER — LIDOCAINE 2% (20 MG/ML) 5 ML SYRINGE
INTRAMUSCULAR | Status: DC | PRN
  Administered 2024-08-20: 50 mg via INTRAVENOUS
  Administered 2024-08-20: 100 mg via INTRAVENOUS

## 2024-08-20 MED ORDER — PROPOFOL 10 MG/ML IV BOLUS
INTRAVENOUS | Status: AC
  Filled 2024-08-20: qty 20

## 2024-08-20 MED ORDER — SODIUM CHLORIDE (PF) 0.9 % IJ SOLN
INTRAMUSCULAR | Status: AC
  Filled 2024-08-20: qty 30

## 2024-08-20 MED ORDER — PHENYLEPHRINE 80 MCG/ML (10ML) SYRINGE FOR IV PUSH (FOR BLOOD PRESSURE SUPPORT)
PREFILLED_SYRINGE | INTRAVENOUS | Status: AC
  Filled 2024-08-20: qty 10

## 2024-08-20 MED ORDER — SODIUM CHLORIDE 0.9 % IR SOLN
Status: DC | PRN
  Administered 2024-08-20: 3000 mL

## 2024-08-20 MED ORDER — ROCURONIUM BROMIDE 10 MG/ML (PF) SYRINGE
PREFILLED_SYRINGE | INTRAVENOUS | Status: AC
Start: 2024-08-20 — End: 2024-08-20
  Filled 2024-08-20: qty 10

## 2024-08-20 MED ORDER — ASPIRIN 81 MG PO TBEC: 81.0000 mg | DELAYED_RELEASE_TABLET | Freq: Every day | ORAL | 11 refills | Status: AC

## 2024-08-20 MED ORDER — LOSARTAN POTASSIUM 100 MG PO TABS: 100.0000 mg | ORAL_TABLET | Freq: Every day | ORAL | 5 refills | Status: AC

## 2024-08-20 MED ORDER — STERILE WATER FOR IRRIGATION IR SOLN
Status: DC | PRN
  Administered 2024-08-20: 500 mL

## 2024-08-20 MED ORDER — BUPIVACAINE HCL (PF) 0.5 % IJ SOLN
INTRAMUSCULAR | Status: DC | PRN
  Administered 2024-08-20: 30 mL

## 2024-08-20 MED ORDER — DEXAMETHASONE SODIUM PHOSPHATE 10 MG/ML IJ SOLN
INTRAMUSCULAR | Status: DC | PRN
  Administered 2024-08-20: 4 mg via INTRAVENOUS

## 2024-08-20 MED ORDER — ALBUMIN HUMAN 5 % IV SOLN
INTRAVENOUS | Status: AC
  Filled 2024-08-20: qty 250

## 2024-08-20 SURGICAL SUPPLY — 39 items
APPLICATOR COTTON TIP 6 STRL (MISCELLANEOUS) IMPLANT
BLADE SURG 15 STRL LF DISP TIS (BLADE) ×1 IMPLANT
CAUTERY HOOK MNPLR 1.6 DVNC XI (INSTRUMENTS) ×1 IMPLANT
CHLORAPREP W/TINT 26 (MISCELLANEOUS) ×1 IMPLANT
CLIP LIGATING HEM O LOK PURPLE (MISCELLANEOUS) ×1 IMPLANT
COVER LIGHT HANDLE STERIS (MISCELLANEOUS) ×2 IMPLANT
DERMABOND ADVANCED .7 DNX12 (GAUZE/BANDAGES/DRESSINGS) ×1 IMPLANT
DRAPE ARM DVNC X/XI (DISPOSABLE) ×4 IMPLANT
DRAPE COLUMN DVNC XI (DISPOSABLE) ×1 IMPLANT
ELECTRODE REM PT RTRN 9FT ADLT (ELECTROSURGICAL) ×1 IMPLANT
FORCEPS BPLR R/ABLATION 8 DVNC (INSTRUMENTS) ×1 IMPLANT
FORCEPS PROGRASP DVNC XI (FORCEP) ×1 IMPLANT
GLOVE BIO SURGEON STRL SZ 6.5 (GLOVE) ×2 IMPLANT
GLOVE BIO SURGEON STRL SZ7 (GLOVE) IMPLANT
GLOVE BIOGEL PI IND STRL 6.5 (GLOVE) ×2 IMPLANT
GLOVE BIOGEL PI IND STRL 7.0 (GLOVE) ×2 IMPLANT
GOWN STRL REUS W/TWL LRG LVL3 (GOWN DISPOSABLE) ×3 IMPLANT
GRASPER SUT TROCAR 14GX15 (MISCELLANEOUS) ×1 IMPLANT
IRRIGATOR SUCT 8 DISP DVNC XI (IRRIGATION / IRRIGATOR) IMPLANT
KIT TURNOVER KIT A (KITS) ×1 IMPLANT
MANIFOLD NEPTUNE II (INSTRUMENTS) IMPLANT
NDL HYPO 21X1.5 SAFETY (NEEDLE) ×1 IMPLANT
NDL INSUFFLATION 14GA 120MM (NEEDLE) ×1 IMPLANT
NEEDLE HYPO 21X1.5 SAFETY (NEEDLE) ×1 IMPLANT
NEEDLE INSUFFLATION 14GA 120MM (NEEDLE) ×1 IMPLANT
OBTURATOR OPTICALSTD 8 DVNC (TROCAR) ×1 IMPLANT
PACK LAP CHOLE LZT030E (CUSTOM PROCEDURE TRAY) ×1 IMPLANT
PAD ARMBOARD POSITIONER FOAM (MISCELLANEOUS) ×1 IMPLANT
PENCIL HANDSWITCHING (ELECTRODE) ×1 IMPLANT
POSITIONER HEAD 8X9X4 ADT (SOFTGOODS) ×1 IMPLANT
SEAL UNIV 5-12 XI (MISCELLANEOUS) ×4 IMPLANT
SET BASIN LINEN APH (SET/KITS/TRAYS/PACK) ×1 IMPLANT
SET TUBE DA VINCI INSUFFLATOR (TUBING) IMPLANT
SOL .9 NS 3000ML IRR UROMATIC (IV SOLUTION) ×1 IMPLANT
SUT MNCRL AB 4-0 PS2 18 (SUTURE) ×2 IMPLANT
SUT VICRYL 0 UR6 27IN ABS (SUTURE) ×1 IMPLANT
SYR 30ML LL (SYRINGE) ×1 IMPLANT
SYSTEM RETRIEVL 5MM INZII UNIV (BASKET) ×1 IMPLANT
WATER STERILE IRR 500ML POUR (IV SOLUTION) ×1 IMPLANT

## 2024-08-20 NOTE — Anesthesia Postprocedure Evaluation (Signed)
 Anesthesia Post Note  Patient: Aaron J Lynn Jr.  Procedure(s) Performed: CHOLECYSTECTOMY, ROBOT-ASSISTED, LAPAROSCOPIC (Abdomen)  Patient location during evaluation: PACU Anesthesia Type: General Level of consciousness: awake and alert Pain management: pain level controlled Vital Signs Assessment: post-procedure vital signs reviewed and stable Respiratory status: spontaneous breathing, nonlabored ventilation, respiratory function stable and patient connected to nasal cannula oxygen Cardiovascular status: blood pressure returned to baseline and stable Postop Assessment: no apparent nausea or vomiting Anesthetic complications: no   No notable events documented.   Last Vitals:  Vitals:   08/20/24 1400 08/20/24 1415  BP: 136/84 (!) 148/95  Pulse: 81 79  Resp: (!) 21 11  Temp:    SpO2: 94% 91%    Last Pain:  Vitals:   08/20/24 1420  TempSrc:   PainSc: 6                  Andrea Limes

## 2024-08-20 NOTE — Transfer of Care (Signed)
 Immediate Anesthesia Transfer of Care Note  Patient: Aaron J Timbrook Jr.  Procedure(s) Performed: CHOLECYSTECTOMY, ROBOT-ASSISTED, LAPAROSCOPIC (Abdomen)  Patient Location: PACU  Anesthesia Type:General  Level of Consciousness: drowsy, patient cooperative, and responds to stimulation  Airway & Oxygen Therapy: Patient Spontanous Breathing and Patient connected to face mask oxygen  Post-op Assessment: Report given to RN and Post -op Vital signs reviewed and stable  Post vital signs: Reviewed and stable  Last Vitals:  Vitals Value Taken Time  BP 131/91 08/20/24 13:27  Temp    Pulse 81 08/20/24 13:29  Resp 23 08/20/24 13:29  SpO2 95 % 08/20/24 13:29  Vitals shown include unfiled device data.  Last Pain:  Vitals:   08/20/24 1116  TempSrc: Oral  PainSc: 0-No pain      Patients Stated Pain Goal: 5 (08/20/24 1116)  Complications: No notable events documented.

## 2024-08-20 NOTE — OR Nursing (Signed)
 ICGreen was given per order .

## 2024-08-20 NOTE — Progress Notes (Signed)
 PROGRESS NOTE  Aaron Peck, is a 63 y.o. male, DOB - July 23, 1961, FMW:[id number]  Admit date - 08/18/2024   Admitting Physician Hanya Guerin Pearlean, MD  Outpatient Primary MD for the patient is [facility]  LOS - 1  Chief Complaint  Patient presents with   Abdominal Pain      Brief Narrative:  63 y.o. male with medical history relevant for tobacco abuse, class I obesity, GERD, OSA and HTN admitted on 08/18/2024 with acute cholecystitis    -Assessment and Plan: 1) acute acalculous cholecystitis--POA - Treated with Rocephin  and Flagyl  - General Surgery consult appreciated S/p Lap chole on 08/20/2024 - Continue as needed pain medications and antiemetics --Postoperative care per general surgeon -  2)GERD--continue  omeprazole    3)HTN--okay to restart PTA losartan  and Coreg    4)Tobacco Abuse---  -Abstinence from cigars advised.   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   Allergies as of 08/20/2024   No Known Allergies      Medication List     TAKE these medications    acetaminophen  500 MG tablet Commonly known as: TYLENOL  Take 1,000 mg by mouth every 6 (six) hours as needed for mild pain (pain score 1-3).   aspirin  EC 81 MG tablet Take 1 tablet (81 mg total) by mouth daily with breakfast. Swallow whole. What changed: when to take this   carvedilol  6.25 MG tablet Commonly known as: COREG  Take 1 tablet (6.25 mg total) by mouth in the morning and at bedtime.   cholecalciferol  25 MCG (1000 UNIT) tablet Commonly known as: VITAMIN D3 Take 1,000 Units by mouth daily.   FLAX SEED OIL PO Take 1 capsule by mouth daily.   losartan  100 MG tablet Commonly known as: COZAAR  Take 1 tablet (100 mg total) by mouth  daily.   multivitamin with minerals Tabs tablet Take 1 tablet by mouth daily.   omeprazole  20 MG capsule Commonly known as: PRILOSEC Take 1 capsule (20 mg total) by mouth daily.   ondansetron  4 MG tablet Commonly known as: ZOFRAN  Take 1 tablet (4 mg total) by mouth every 6 (six) hours as needed for nausea.   oxyCODONE  5 MG immediate release tablet Commonly known as: Oxy IR/ROXICODONE  Take 1 tablet (5 mg total) by mouth every 4 (four) hours as needed for severe pain (pain score 7-10) or breakthrough pain.       Disposition: The patient is from: Home              Anticipated d/c is to: Home              Anticipated d/c date is: 1 day              Patient currently is not medically stable to d/c. Barriers: Not Clinically Stable-   Code Status :  -  Code Status: Full Code   Family Communication:   NA (patient is alert, awake and coherent)   Discussed with wife at bedside, questions answered  DVT Prophylaxis  :   - SCDs   SCD's Start: 08/19/24 1138 heparin  injection 5,000 Units Start: 08/18/24 2200 SCDs Start: 08/18/24 0817 Place TED hose Start: 08/18/24 0817   Lab Results  Component Value Date   PLT 195 08/19/2024   Inpatient Medications  Scheduled Meds:  [MAR Hold]  carvedilol   6.25 mg Oral BID WC   chlorhexidine   15 mL Mouth/Throat Once   Or   mouth rinse  15 mL Mouth Rinse Once   [MAR Hold] cholecalciferol   1,000 Units Oral Daily   [MAR Hold] heparin   5,000 Units Subcutaneous Q8H   indocyanine green        indocyanine green   2.5 mg Intravenous Once   [MAR Hold] multivitamin with minerals  1 tablet Oral Daily   [MAR Hold] NIFEdipine   90 mg Oral Daily   [MAR Hold] pantoprazole   40 mg Oral Daily   [MAR Hold] sodium chloride  flush  3 mL Intravenous Q12H   [MAR Hold] sodium chloride  flush  3 mL Intravenous Q12H   Continuous Infusions:  [MAR Hold] cefTRIAXone  (ROCEPHIN )  IV 2 g (08/20/24 0539)   lactated ringers      [MAR Hold] metroNIDAZOLE  500 mg (08/20/24 1100)    PRN Meds:.[MAR Hold] acetaminophen  **OR** [MAR Hold] acetaminophen , [MAR Hold] bisacodyl , [MAR Hold] hydrALAZINE , HYDROmorphone  (DILAUDID ) injection, [MAR Hold]  HYDROmorphone  (DILAUDID ) injection, indocyanine green , [MAR Hold] ondansetron  **OR** [MAR Hold] ondansetron  (ZOFRAN ) IV, [MAR Hold] oxyCODONE , oxyCODONE  **OR** oxyCODONE , [MAR Hold] polyethylene glycol, [MAR Hold] sodium chloride  flush  Anti-infectives (From admission, onward)    Start     Dose/Rate Route Frequency Ordered Stop   08/20/24 1130  cefoTEtan  (CEFOTAN ) 2 g in sodium chloride  0.9 % 100 mL IVPB        2 g 200 mL/hr over 30 Minutes Intravenous On call to O.R. 08/19/24 1137 08/20/24 1219   08/19/24 0600  [MAR Hold]  cefTRIAXone  (ROCEPHIN ) 2 g in sodium chloride  0.9 % 100 mL IVPB        (MAR Hold since Mon 08/20/2024 at 1110.Hold Reason: Transfer to a Procedural area)   2 g 200 mL/hr over 30 Minutes Intravenous Every 24 hours 08/18/24 0818 08/23/24 0559   08/18/24 2000  [MAR Hold]  metroNIDAZOLE  (FLAGYL ) IVPB 500 mg        (MAR Hold since Mon 08/20/2024 at 1110.Hold Reason: Transfer to a Procedural area)   500 mg 100 mL/hr over 60 Minutes Intravenous Every 12 hours 08/18/24 0818 08/23/24 0959   08/18/24 0700  cefTRIAXone  (ROCEPHIN ) 2 g in sodium chloride  0.9 % 100 mL IVPB        2 g 200 mL/hr over 30 Minutes Intravenous  Once 08/18/24 0656 08/18/24 0739   08/18/24 0700  metroNIDAZOLE  (FLAGYL ) IVPB 500 mg        500 mg 100 mL/hr over 60 Minutes Intravenous  Once 08/18/24 0656 08/18/24 9180       Subjective: Aaron Peck today has no fevers, no emesis,  No chest pain,   -Discussed with wife at bedside, questions answered - Tolerating liquid diet well post op   Objective: Vitals:   08/20/24 1116 08/20/24 1327 08/20/24 1330 08/20/24 1345  BP: (!) 146/93 (!) 121/91 (!) 121/91 136/84  Pulse: 73 80 81 79  Resp: 15 17 (!) 23 (!) 9  Temp: 98.7 F (37.1 C) 97.9 F (36.6 C)    TempSrc: Oral     SpO2: 100% 100% 95%  100%  Weight:      Height:        Intake/Output Summary (Last 24 hours) at 08/20/2024 1348 Last data filed at 08/20/2024 1232 Gross per 24 hour  Intake 2354.86 ml  Output --  Net 2354.86 ml   Filed Weights   08/18/24 0445 08/18/24 0910  Weight: 89.8 kg 90.3 kg    Physical Exam  Gen:- Awake Alert,  in no apparent distress  HEENT:- [place].AT, No sclera icterus Neck-Supple Neck,No JVD,.  Lungs-  CTAB , fair symmetrical air movement CV- S1, S2 normal, regular  Abd-  +ve B.Sounds, Abd Soft, appropriate postoperative tenderness,, postoperative wounds are hemostatic Extremity/Skin:- No  edema, pedal pulses present  Psych-affect is appropriate, oriented x3 Neuro-no new focal deficits, no tremors  Data Reviewed: I have personally reviewed following labs and imaging studies  CBC: Recent Labs  Lab 08/18/24 0500 08/19/24 0348  WBC 7.6 7.1  HGB 15.5 14.4  HCT 46.6 43.7  MCV 93.2 94.6  PLT 233 195   Basic Metabolic Panel: Recent Labs  Lab 08/18/24 0500 08/19/24 0348  NA 141 137  K 3.7 3.4*  CL 105 107  CO2 25 25  GLUCOSE 141* 112*  BUN 17 12  CREATININE 1.54* 1.32*  CALCIUM 9.6 8.8*   GFR: Estimated Creatinine Clearance: 57.3 mL/min (A) (by C-G formula based on SCr of 1.32 mg/dL (H)). Liver Function Tests: Recent Labs  Lab 08/18/24 0500 08/19/24 0348  AST 17 12*  ALT 15 13  ALKPHOS 67 54  BILITOT 0.5 0.6  PROT 7.5 6.4*  ALBUMIN  3.8 3.1*   Recent Results (from the past 240 hours)  MRSA Next Gen by PCR, Nasal     Status: None   Collection Time: 08/18/24 12:14 PM   Specimen: Nasal Mucosa; Nasal Swab  Result Value Ref Range Status   MRSA by PCR Next Gen NOT DETECTED NOT DETECTED Final    Comment: (NOTE) The GeneXpert MRSA Assay (FDA approved for NASAL specimens only), is one component of a comprehensive MRSA colonization surveillance program. It is not intended to diagnose MRSA infection nor to guide or monitor treatment for MRSA infections. Test performance is  not FDA approved in patients less than 75 years old. Performed at [facility], [address] [place], [place] [address]      Scheduled Meds:  [MAR Hold] carvedilol   6.25 mg Oral BID WC   chlorhexidine   15 mL Mouth/Throat Once   Or   mouth rinse  15 mL Mouth Rinse Once   [MAR Hold] cholecalciferol   1,000 Units Oral Daily   [MAR Hold] heparin   5,000 Units Subcutaneous Q8H   indocyanine green        indocyanine green   2.5 mg Intravenous Once   [MAR Hold] multivitamin with minerals  1 tablet Oral Daily   [MAR Hold] NIFEdipine   90 mg Oral Daily   [MAR Hold] pantoprazole   40 mg Oral Daily   [MAR Hold] sodium chloride  flush  3 mL Intravenous Q12H   [MAR Hold] sodium chloride  flush  3 mL Intravenous Q12H   Continuous Infusions:  [MAR Hold] cefTRIAXone  (ROCEPHIN )  IV 2 g (08/20/24 0539)   lactated ringers      [MAR Hold] metroNIDAZOLE  500 mg (08/20/24 1100)    LOS: 1 day   Aaron Peck M.D on 08/20/2024 at 1:48 PM  Go to [email or web address] - for contact info  [facility] - Office  [phone]  If 7PM-7AM, please contact night-coverage [email or web address] 08/20/2024, 1:48 PM

## 2024-08-20 NOTE — Interval H&P Note (Signed)
 History and Physical Interval Note:  08/20/2024 9:24 AM  Aaron Peck.  has presented today for surgery, with the diagnosis of acute cholecystitis.  The various methods of treatment have been discussed with the patient and family. After consideration of risks, benefits and other options for treatment, the patient has consented to  Procedure(s): CHOLECYSTECTOMY, ROBOT-ASSISTED, LAPAROSCOPIC ([name]) as a surgical intervention.  The patient's history has been reviewed, patient examined, no change in status, stable for surgery.  I have reviewed the patient's chart and labs.  Questions were answered to the patient's satisfaction.     Aaron Peck

## 2024-08-20 NOTE — Discharge Summary (Signed)
 Physician Discharge Summary  Patient ID: Aaron Peck. MRN: [id number] DOB/AGE: [birth year] 63 y.o.  Admit date: 08/18/2024 Discharge date: 08/21/2024  Admission Diagnoses: Acute cholecystitis   Discharge Diagnoses:  Principal Problem:   Acute cholecystitis Active Problems:   HTN (hypertension)   Obesity (BMI 30-39.9)   Discharged Condition: good  Hospital Course: Aaron Peck is a 63 yo who comes in with concern for acute cholecystitis. He was symptomatic and was kept over the weekend for antibiotics. He was able to tolerate a diet.  After his cholecystectomy, he did well and was able to go home  tolerating a diet and pain control.   Consults: hospitalist admission- taken over by surgery  Significant Diagnostic Studies:  Lab Results  Component Value Date   WBC 7.1 08/19/2024   HGB 14.4 08/19/2024   HCT 43.7 08/19/2024   MCV 94.6 08/19/2024   PLT 195 08/19/2024    Lab Results  Component Value Date   ALT 13 08/19/2024   AST 12 (L) 08/19/2024   ALKPHOS 54 08/19/2024   BILITOT 0.6 08/19/2024     Treatments: IV hydration and antibiotics: Flagyl , Ceftriaxone    Discharge Exam: Blood pressure (!) 134/91, pulse 80, temperature 97.8 F (36.6 C), temperature source Oral, resp. rate 12, height 5' 4 (1.626 m), weight 90.3 kg, SpO2 92%.   Disposition: Discharge disposition: 01-Home or Self Care       Discharge Instructions     Call MD for:  difficulty breathing, headache or visual disturbances   Complete by: As directed    Call MD for:  extreme fatigue   Complete by: As directed    Call MD for:  persistant dizziness or light-headedness   Complete by: As directed    Call MD for:  persistant nausea and vomiting   Complete by: As directed    Call MD for:  redness, tenderness, or signs of infection (pain, swelling, redness, odor or green/yellow discharge around incision site)   Complete by: As directed    Call MD for:  severe uncontrolled pain   Complete by: As  directed    Call MD for:  temperature >100.4   Complete by: As directed    Increase activity slowly   Complete by: As directed       Allergies as of 08/20/2024   No Known Allergies      Medication List     TAKE these medications    acetaminophen  500 MG tablet Commonly known as: TYLENOL  Take 1,000 mg by mouth every 6 (six) hours as needed for mild pain (pain score 1-3).   aspirin  EC 81 MG tablet Take 1 tablet (81 mg total) by mouth daily with breakfast. Swallow whole. What changed: when to take this   carvedilol  6.25 MG tablet Commonly known as: COREG  Take 1 tablet (6.25 mg total) by mouth in the morning and at bedtime.   cholecalciferol  25 MCG (1000 UNIT) tablet Commonly known as: VITAMIN D3 Take 1,000 Units by mouth daily.   FLAX SEED OIL PO Take 1 capsule by mouth daily.   losartan  100 MG tablet Commonly known as: COZAAR  Take 1 tablet (100 mg total) by mouth daily.   multivitamin with minerals Tabs tablet Take 1 tablet by mouth daily.   omeprazole  20 MG capsule Commonly known as: PRILOSEC Take 1 capsule (20 mg total) by mouth daily.   ondansetron  4 MG tablet Commonly known as: ZOFRAN  Take 1 tablet (4 mg total) by mouth every 6 (six) hours as needed for  nausea.   oxyCODONE  5 MG immediate release tablet Commonly known as: Oxy IR/ROXICODONE  Take 1 tablet (5 mg total) by mouth every 4 (four) hours as needed for severe pain (pain score 7-10) or breakthrough pain.        Follow-up Information     Kallie Manuelita BROCKS, MD Follow up on 09/05/2024.   Specialty: General Surgery Why: phone call, if you need to be seen in person call the office Contact information: [address] [place] [facility] [address] [phone]                 Signed: Manuelita BROCKS Kallie 08/21/2024, 4:43 PM

## 2024-08-20 NOTE — Discharge Instructions (Signed)
 Discharge Robotic Assisted Laparoscopic Surgery Instructions:  Common Complaints: Right shoulder pain is common after laparoscopic surgery.  This is secondary to the gas used in the surgery being trapped under the diaphragm.  Walk to help your body absorb the gas. This will improve in a few days. Pain at the port sites are common, especially the larger port sites. This will improve with time.  Some nausea is common and poor appetite. The main goal is to stay hydrated the first few days after surgery.   Diet/ Activity: Diet as tolerated. You may not have an appetite, but it is important to stay hydrated.  Drink 64 ounces of water  a day. Your appetite will return with time.  Shower per your regular routine daily.  Do not take hot showers. Take warm showers that are less than 10 minutes. Rest and listen to your body, but do not remain in bed all day.  Walk everyday for at least 15-20 minutes. Deep cough and move around every 1-2 hours in the first few days after surgery.  Do not lift > 10 lbs, perform excessive bending, pushing, pulling, squatting for 1-2 weeks after surgery.  Do not pick at the dermabond glue on your incision sites.  This glue film will remain in place for 1-2 weeks and will start to peel off.  Do not place lotions or balms on your incision unless instructed to specifically by Dr. Kallie.   Pain Expectations and Narcotics: -After surgery you will have pain associated with your incisions and this is normal. The pain is muscular and nerve pain, and will get better with time. -You are encouraged and expected to take non narcotic medications like tylenol  and ibuprofen  (when able) to treat pain as multiple modalities can aid with pain treatment. -Narcotics are only used when pain is severe or there is breakthrough pain. -You are not expected to have a pain score of 0 after surgery, as we cannot prevent pain. A pain score of 3-4 that allows you to be functional, move, walk, and  tolerate some activity is the goal. The pain will continue to improve over the days after surgery and is dependent on your surgery. -Due to [place] law, we are only able to give a certain amount of pain medication to treat post operative pain, and we only give additional narcotics on a patient by patient basis.  -For most laparoscopic surgery, studies have shown that the majority of patients only need 10-15 narcotic pills, and for open surgeries most patients only need 15-20.   -Having appropriate expectations of pain and knowledge of pain management with non narcotics is important as we do not want anyone to become addicted to narcotic pain medication.  -Using ice packs in the first 48 hours and heating pads after 48 hours, wearing an abdominal binder (when recommended), and using over the counter medications are all ways to help with pain management.   -Simple acts like meditation and mindfulness practices after surgery can also help with pain control and research has proven the benefit of these practices.  Medication: Take tylenol  and ibuprofen  as needed for pain control, alternating every 4-6 hours.  Example:  Tylenol  1000mg  @ 6am, 12noon, 6pm, (Do not exceed 4000mg  of tylenol  a day). Ibuprofen  800mg  @ 9am, 3pm, 9pm, 3am (Do not exceed 3600mg  of ibuprofen  a day).  Take Roxicodone  for breakthrough pain every 4 hours.  Take Colace for constipation related to narcotic pain medication. If you do not have a bowel movement in  2 days, take Miralax over the counter.  Drink plenty of water  to also prevent constipation.   Contact Information: If you have questions or concerns, please call our office, [phone], Monday- Thursday 8AM-5PM and Friday 8AM-12Noon.  If it is after hours or on the weekend, please call [facility], [phone], [phone], and ask to speak to the surgeon on call for Dr. Kallie at [facility].

## 2024-08-20 NOTE — Progress Notes (Addendum)
 [facility]  Updated family. PRN for pain, diet as tolerated. Will see how the patient feels and possibly home later today. No need for further antibiotics.   9/24 phone call.  Manuelita Pander, MD [facility] [address] [place], [place] [address] [phone] (office)

## 2024-08-20 NOTE — Anesthesia Preprocedure Evaluation (Addendum)
 Anesthesia Evaluation  Patient identified by MRN, date of birth, ID band Patient awake    Reviewed: Allergy & Precautions, H&P , NPO status , Patient's Chart, lab work & pertinent test results  Airway Mallampati: II  TM Distance: >3 FB Neck ROM: Full    Dental  (+) Loose   Pulmonary sleep apnea    Pulmonary exam normal breath sounds clear to auscultation       Cardiovascular hypertension, Normal cardiovascular exam Rhythm:Regular Rate:Normal     Neuro/Psych negative neurological ROS  negative psych ROS   GI/Hepatic Neg liver ROS,GERD  ,,  Endo/Other  negative endocrine ROS    Renal/GU negative Renal ROS  negative genitourinary   Musculoskeletal negative musculoskeletal ROS (+)    Abdominal   Peds negative pediatric ROS (+)  Hematology negative hematology ROS (+)   Anesthesia Other Findings   Reproductive/Obstetrics negative OB ROS                              Anesthesia Physical Anesthesia Plan  ASA: 2 and emergent  Anesthesia Plan: General   Post-op Pain Management:    Induction: Intravenous, Cricoid pressure planned and Rapid sequence  PONV Risk Score and Plan:   Airway Management Planned: Oral ETT  Additional Equipment:   Intra-op Plan:   Post-operative Plan: Extubation in OR  Informed Consent: I have reviewed the patients History and Physical, chart, labs and discussed the procedure including the risks, benefits and alternatives for the proposed anesthesia with the patient or authorized representative who has indicated his/her understanding and acceptance.     Dental advisory given  Plan Discussed with: CRNA  Anesthesia Plan Comments:          Anesthesia Quick Evaluation

## 2024-08-20 NOTE — Op Note (Signed)
 [facility] Operative Note  08/20/24  Preoperative Diagnosis: Acute cholecystitis    Postoperative Diagnosis: Same   Procedure(s) Performed: Robotic Assisted Laparoscopic Cholecystectomy   Surgeon: Manuelita BROCKS. Kallie, MD   Assistants: No qualified resident was available    Anesthesia: General endotracheal   Anesthesiologist: Herschell Hollering, MD    Specimens: Gallbladder   Estimated Blood Loss: Minimal   Blood Replacement: None    Complications: None   Wound Class: Contaminated    Operative Indications: The patient was found to have  acute cholecystitis on imaging and was symptomatic.  We discussed the risk of the procedure including but not limited to bleeding, infection, injury to the common bile duct, bile leak, need for further procedures, chance of subtotal cholecystectomy.   Findings:  Edematous gallbladder  Critical view of safety noted All clips intact at the end of the case Adequate hemostasis   Procedure: Firefly was given in the preoperative area. The patient was taken to the operating room and placed supine. General endotracheal anesthesia was induced. Intravenous antibiotics were  administered per protocol.  An orogastric tube positioned to decompress the stomach. The abdomen was prepared and draped in the usual sterile fashion.   Veress needle was placed at the supraumbilical area and insufflation was started after confirming a positive saline drop test and no immediate increase in abdominal pressure.  After reaching 15 mm, the Veress needle was removed and a 8 mm port was placed via optiview technique supraumbilical, measuring 20 mm away from the suspected position of the gallbladder.  The abdomen was inspected and no abnormalities or injuries were found.  Under direct vision, ports were placed in the following locations in a semi curvilinear position around the target of the gallbladder: Two 8 mm ports on the patient's right each having 8cm  clearance to the adjacent ports and one 8 mm port placed on the patient's left 8 cm from the umbilical port. Once ports were placed, the table was placed in the reverse Trendelenburg position with the right side up. The robotic platform was brought into the operative field and docked to the ports successfully.  An endoscope was placed through the umbilical port, prograsper through the most lateral right port, forced bipolar to the port just right of the umbilicus, and then a hook cautery in the left port.   The dome of the gallbladder was grasped with prograsp and retracted over the dome of the liver. Adhesions between the gallbladder and omentum, duodenum and transverse colon were lysed via hook cautery. The infundibulum was grasped with the forced bipolar and retracted toward the right lower quadrant. This maneuver exposed Calot's triangle. Firefly was used throughout the dissection to ensure safe visualization of the cystic duct.The peritoneum overlying the gallbladder infundibulum was then dissected and the cystic duct and cystic artery identified.  Critical view of safety with the liver bed clearly visible behind the duct and artery with no additional structures noted.  The cystic duct and cystic artery were doubly clipped and divided close to the gallbladder.    The gallbladder was then dissected from its peritoneal and liver bed attachments by electrocautery. There was some minor spillage of bile. The suction irrigation was used to evacuate this bile. Hemostasis was checked prior to removing the hook cautery.  A 5mm Endo Catch bag was then placed through the left side port. The robotic was undocked and moved out of the field,  and the gallbladder was removed in the bag.  The gallbladder was  passed off the table as a specimen. There was no evidence of bleeding from the gallbladder fossa or cystic artery or leakage of the bile from the cystic duct stump. The left port site closed with a 0 vicryl and PMI  due to dilation from removing the gallbladder.The abdomen was desufflated and secondary trocars were removed under direct vision.   No bleeding was noted. All skin incisions were closed with subcuticular sutures of 4-0 monocryl and dermabond.   Final inspection revealed acceptable hemostasis. All counts were correct at the end of the case. The patient was awakened from anesthesia and extubated without complication. The OG tube was removed.  The patient went to the PACU in stable condition.   Manuelita Pander, MD [facility] [address] [place], [place] [address] [phone] (office)

## 2024-08-20 NOTE — Progress Notes (Signed)
 Discharge instructions reviewed with patient and wife, both verbalized understanding of instructions.  Patient discharged home with wife in stable condition.

## 2024-08-20 NOTE — Anesthesia Procedure Notes (Addendum)
 Procedure Name: Intubation Date/Time: 08/20/2024 11:43 AM  Performed by: Pheobe Adine CROME, CRNAPre-anesthesia Checklist: Patient identified, Emergency Drugs available, Suction available, Patient being monitored and Timeout performed Patient Re-evaluated:Patient Re-evaluated prior to induction Oxygen Delivery Method: Circle system utilized Preoxygenation: Pre-oxygenation with 100% oxygen Induction Type: IV induction Laryngoscope Size: Mac and 3 Grade View: Grade I Tube size: 7.5 mm Number of attempts: 1 Airway Equipment and Method: Stylet and Video-laryngoscopy Placement Confirmation: ETT inserted through vocal cords under direct vision, positive ETCO2 and CO2 detector Secured at: 23 cm Tube secured with: Tape Dental Injury: Teeth and Oropharynx as per pre-operative assessment  Comments: Pt states 3 loose teeth, lower back molars bilaterally, and left upper canine. Advised pt that he could lose them from placing airway or removing it, or placing and OG tube. Pt states he understands

## 2024-08-21 LAB — SURGICAL PATHOLOGY

## 2024-09-05 ENCOUNTER — Encounter: Payer: Self-pay | Admitting: General Surgery

## 2024-09-05 ENCOUNTER — Ambulatory Visit (INDEPENDENT_AMBULATORY_CARE_PROVIDER_SITE_OTHER): Admitting: General Surgery

## 2024-09-05 VITALS — BP 163/108 | HR 72 | Temp 98.1°F | Resp 16 | Ht 64.0 in | Wt 199.0 lb

## 2024-09-05 DIAGNOSIS — K8020 Calculus of gallbladder without cholecystitis without obstruction: Secondary | ICD-10-CM

## 2024-09-05 NOTE — Progress Notes (Signed)
 [facility]  Doing well. No complaints. Eating and having bms   Pathology: GALLBLADDER, CHOLECYSTECTOMY:  Chronic and subacute cholecystitis   BP (!) 163/108   Pulse 72   Temp 98.1 F (36.7 C) (Oral)   Resp 16   Ht 5' 4 (1.626 m)   Wt 199 lb (90.3 kg)   SpO2 92%   BMI 34.16 kg/m  Soft, nondistended, healed incision sites, no erythema or drainage  Patient s/p robotic cholecystectomy. Doing well   Diet and activity as tolerated Call with issues  Manuelita Pander, MD [facility] [address] [place], [place] [address] [phone] (office)

## 2024-09-05 NOTE — Patient Instructions (Signed)
Diet and activity as tolerated. Call with issues.
